# Patient Record
Sex: Female | Born: 1960 | Race: White | Hispanic: No | Smoking: Never smoker
Health system: Southern US, Community
[De-identification: ages and names within clinical notes are randomized; demographics above are authoritative.]

## PROBLEM LIST (undated history)

## (undated) DIAGNOSIS — S83511A Sprain of anterior cruciate ligament of right knee, initial encounter: Secondary | ICD-10-CM

## (undated) DIAGNOSIS — F50819 Binge eating disorder, unspecified: Secondary | ICD-10-CM

## (undated) DIAGNOSIS — R5383 Other fatigue: Secondary | ICD-10-CM

## (undated) DIAGNOSIS — S838X1A Sprain of other specified parts of right knee, initial encounter: Secondary | ICD-10-CM

## (undated) DIAGNOSIS — F5081 Binge eating disorder: Secondary | ICD-10-CM

## (undated) DIAGNOSIS — F319 Bipolar disorder, unspecified: Secondary | ICD-10-CM

## (undated) DIAGNOSIS — F329 Major depressive disorder, single episode, unspecified: Secondary | ICD-10-CM

## (undated) DIAGNOSIS — F32A Depression, unspecified: Secondary | ICD-10-CM

## (undated) DIAGNOSIS — G4730 Sleep apnea, unspecified: Secondary | ICD-10-CM

## (undated) HISTORY — DX: Other fatigue: R53.83

## (undated) HISTORY — PX: TUBAL LIGATION: SHX77

## (undated) HISTORY — DX: Binge eating disorder, unspecified: F50.819

## (undated) HISTORY — PX: ABDOMINAL HYSTERECTOMY: SHX81

## (undated) HISTORY — PX: DILATION AND CURETTAGE OF UTERUS: SHX78

## (undated) HISTORY — DX: Binge eating disorder: F50.81

## (undated) HISTORY — DX: Sleep apnea, unspecified: G47.30

---

## 1997-09-27 ENCOUNTER — Other Ambulatory Visit: Admission: RE | Admit: 1997-09-27 | Discharge: 1997-09-27 | Payer: Self-pay | Admitting: Gynecology

## 1999-02-01 ENCOUNTER — Other Ambulatory Visit: Admission: RE | Admit: 1999-02-01 | Discharge: 1999-02-01 | Payer: Self-pay | Admitting: Gynecology

## 1999-08-01 ENCOUNTER — Encounter: Payer: Self-pay | Admitting: Family Medicine

## 1999-08-01 ENCOUNTER — Encounter: Admission: RE | Admit: 1999-08-01 | Discharge: 1999-08-01 | Payer: Self-pay | Admitting: Family Medicine

## 1999-09-14 ENCOUNTER — Encounter (INDEPENDENT_AMBULATORY_CARE_PROVIDER_SITE_OTHER): Payer: Self-pay | Admitting: Specialist

## 1999-09-14 ENCOUNTER — Other Ambulatory Visit: Admission: RE | Admit: 1999-09-14 | Discharge: 1999-09-14 | Payer: Self-pay | Admitting: Gynecology

## 2000-02-04 ENCOUNTER — Other Ambulatory Visit: Admission: RE | Admit: 2000-02-04 | Discharge: 2000-02-04 | Payer: Self-pay | Admitting: Gynecology

## 2000-02-07 ENCOUNTER — Other Ambulatory Visit: Admission: RE | Admit: 2000-02-07 | Discharge: 2000-02-07 | Payer: Self-pay | Admitting: Gynecology

## 2000-02-07 ENCOUNTER — Encounter (INDEPENDENT_AMBULATORY_CARE_PROVIDER_SITE_OTHER): Payer: Self-pay | Admitting: Specialist

## 2001-05-13 ENCOUNTER — Other Ambulatory Visit: Admission: RE | Admit: 2001-05-13 | Discharge: 2001-05-13 | Payer: Self-pay | Admitting: Gynecology

## 2002-03-11 ENCOUNTER — Emergency Department (HOSPITAL_COMMUNITY): Admission: EM | Admit: 2002-03-11 | Discharge: 2002-03-11 | Payer: Self-pay | Admitting: Emergency Medicine

## 2002-05-06 HISTORY — PX: FRACTURE SURGERY: SHX138

## 2002-05-06 HISTORY — PX: WOUND DEBRIDEMENT: SHX247

## 2003-03-28 ENCOUNTER — Inpatient Hospital Stay (HOSPITAL_COMMUNITY): Admission: AC | Admit: 2003-03-28 | Discharge: 2003-04-04 | Payer: Self-pay | Admitting: Emergency Medicine

## 2003-04-19 ENCOUNTER — Observation Stay (HOSPITAL_COMMUNITY): Admission: EM | Admit: 2003-04-19 | Discharge: 2003-04-20 | Payer: Self-pay | Admitting: Emergency Medicine

## 2003-04-20 ENCOUNTER — Inpatient Hospital Stay (HOSPITAL_COMMUNITY): Admission: EM | Admit: 2003-04-20 | Discharge: 2003-04-30 | Payer: Self-pay | Admitting: Neurosurgery

## 2003-04-20 ENCOUNTER — Inpatient Hospital Stay (HOSPITAL_COMMUNITY): Admission: EM | Admit: 2003-04-20 | Discharge: 2003-04-20 | Payer: Self-pay | Admitting: Psychiatry

## 2003-05-07 HISTORY — PX: OVARIAN CYST SURGERY: SHX726

## 2003-05-30 ENCOUNTER — Other Ambulatory Visit: Admission: RE | Admit: 2003-05-30 | Discharge: 2003-05-30 | Payer: Self-pay | Admitting: Obstetrics and Gynecology

## 2003-12-29 ENCOUNTER — Other Ambulatory Visit: Admission: RE | Admit: 2003-12-29 | Discharge: 2003-12-29 | Payer: Self-pay | Admitting: Gynecology

## 2004-04-17 ENCOUNTER — Observation Stay (HOSPITAL_COMMUNITY): Admission: RE | Admit: 2004-04-17 | Discharge: 2004-04-18 | Payer: Self-pay | Admitting: Gynecology

## 2004-04-17 ENCOUNTER — Encounter (INDEPENDENT_AMBULATORY_CARE_PROVIDER_SITE_OTHER): Payer: Self-pay | Admitting: *Deleted
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
## 2005-12-30 ENCOUNTER — Other Ambulatory Visit: Admission: RE | Admit: 2005-12-30 | Discharge: 2005-12-30 | Payer: Self-pay | Admitting: Gynecology

## 2008-03-04 ENCOUNTER — Encounter: Payer: Self-pay | Admitting: Internal Medicine

## 2008-03-07 ENCOUNTER — Other Ambulatory Visit: Admission: RE | Admit: 2008-03-07 | Discharge: 2008-03-07 | Payer: Self-pay | Admitting: Gynecology

## 2008-03-10 ENCOUNTER — Ambulatory Visit: Payer: Self-pay | Admitting: Internal Medicine

## 2008-05-30 ENCOUNTER — Ambulatory Visit: Payer: Self-pay | Admitting: Internal Medicine

## 2008-06-07 ENCOUNTER — Encounter: Payer: Self-pay | Admitting: Internal Medicine

## 2008-11-30 ENCOUNTER — Ambulatory Visit: Payer: Self-pay | Admitting: Internal Medicine

## 2008-11-30 DIAGNOSIS — R635 Abnormal weight gain: Secondary | ICD-10-CM | POA: Insufficient documentation

## 2008-11-30 DIAGNOSIS — R5383 Other fatigue: Secondary | ICD-10-CM | POA: Insufficient documentation

## 2008-12-06 ENCOUNTER — Encounter (INDEPENDENT_AMBULATORY_CARE_PROVIDER_SITE_OTHER): Payer: Self-pay | Admitting: *Deleted

## 2008-12-06 ENCOUNTER — Telehealth (INDEPENDENT_AMBULATORY_CARE_PROVIDER_SITE_OTHER): Payer: Self-pay | Admitting: *Deleted

## 2008-12-06 LAB — CONVERTED CEMR LAB
ALT: 57 units/L — ABNORMAL HIGH (ref 0–35)
AST: 49 units/L — ABNORMAL HIGH (ref 0–37)
Albumin: 4 g/dL (ref 3.5–5.2)
Alkaline Phosphatase: 79 units/L (ref 39–117)
BUN: 10 mg/dL (ref 6–23)
Basophils Absolute: 0 10*3/uL (ref 0.0–0.1)
Basophils Relative: 0.6 % (ref 0.0–3.0)
Bilirubin, Direct: 0 mg/dL (ref 0.0–0.3)
CO2: 32 meq/L (ref 19–32)
Calcium: 8.9 mg/dL (ref 8.4–10.5)
Chloride: 107 meq/L (ref 96–112)
Creatinine, Ser: 0.8 mg/dL (ref 0.4–1.2)
Eosinophils Absolute: 0.1 10*3/uL (ref 0.0–0.7)
Eosinophils Relative: 3.1 % (ref 0.0–5.0)
Free T4: 0.8 ng/dL (ref 0.6–1.6)
GFR calc non Af Amer: 81.59 mL/min (ref >60)
Glucose, Bld: 91 mg/dL (ref 70–99)
HCT: 35.9 % — ABNORMAL LOW (ref 36.0–46.0)
Hemoglobin: 12.3 g/dL (ref 12.0–15.0)
Lymphocytes Relative: 31 % (ref 12.0–46.0)
Lymphs Abs: 1.5 10*3/uL (ref 0.7–4.0)
MCHC: 34.4 g/dL (ref 30.0–36.0)
MCV: 89.7 fL (ref 78.0–100.0)
Monocytes Absolute: 0.3 10*3/uL (ref 0.1–1.0)
Monocytes Relative: 7.2 % (ref 3.0–12.0)
Neutro Abs: 2.9 10*3/uL (ref 1.4–7.7)
Neutrophils Relative %: 58.1 % (ref 43.0–77.0)
Platelets: 205 10*3/uL (ref 150.0–400.0)
Potassium: 4.4 meq/L (ref 3.5–5.1)
RBC: 4 M/uL (ref 3.87–5.11)
RDW: 11.8 % (ref 11.5–14.6)
Sed Rate: 15 mm/hr (ref 0–22)
Sodium: 143 meq/L (ref 135–145)
TSH: 0.7 microintl units/mL (ref 0.35–5.50)
Total Bilirubin: 0.6 mg/dL (ref 0.3–1.2)
Total Protein: 6.7 g/dL (ref 6.0–8.3)
Vit D, 25-Hydroxy: 58 ng/mL (ref 30–89)
WBC: 4.8 10*3/uL (ref 4.5–10.5)

## 2009-03-13 ENCOUNTER — Telehealth: Payer: Self-pay | Admitting: Internal Medicine

## 2009-03-14 ENCOUNTER — Encounter (INDEPENDENT_AMBULATORY_CARE_PROVIDER_SITE_OTHER): Payer: Self-pay | Admitting: *Deleted

## 2009-04-26 ENCOUNTER — Ambulatory Visit: Payer: Self-pay | Admitting: Pulmonary Disease

## 2009-05-08 ENCOUNTER — Ambulatory Visit: Payer: Self-pay | Admitting: Pulmonary Disease

## 2009-05-08 ENCOUNTER — Ambulatory Visit (HOSPITAL_BASED_OUTPATIENT_CLINIC_OR_DEPARTMENT_OTHER): Admission: RE | Admit: 2009-05-08 | Discharge: 2009-05-08 | Payer: Self-pay | Admitting: Pulmonary Disease

## 2009-05-29 ENCOUNTER — Encounter: Payer: Self-pay | Admitting: Pulmonary Disease

## 2009-05-31 ENCOUNTER — Telehealth: Payer: Self-pay | Admitting: Pulmonary Disease

## 2009-11-15 ENCOUNTER — Telehealth: Payer: Self-pay | Admitting: Internal Medicine

## 2010-02-06 ENCOUNTER — Ambulatory Visit: Payer: Self-pay | Admitting: Internal Medicine

## 2010-02-06 ENCOUNTER — Encounter: Payer: Self-pay | Admitting: Internal Medicine

## 2010-02-06 DIAGNOSIS — F319 Bipolar disorder, unspecified: Secondary | ICD-10-CM | POA: Insufficient documentation

## 2010-02-07 LAB — CONVERTED CEMR LAB
ALT: 73 units/L — ABNORMAL HIGH (ref 0–35)
AST: 45 units/L — ABNORMAL HIGH (ref 0–37)
Albumin: 4.1 g/dL (ref 3.5–5.2)
Alkaline Phosphatase: 94 units/L (ref 39–117)
BUN: 11 mg/dL (ref 6–23)
Basophils Absolute: 0 10*3/uL (ref 0.0–0.1)
Basophils Relative: 1.1 % (ref 0.0–3.0)
Bilirubin, Direct: 0.1 mg/dL (ref 0.0–0.3)
CO2: 29 meq/L (ref 19–32)
Calcium: 9.6 mg/dL (ref 8.4–10.5)
Chloride: 105 meq/L (ref 96–112)
Cholesterol: 278 mg/dL — ABNORMAL HIGH (ref 0–200)
Creatinine, Ser: 0.9 mg/dL (ref 0.4–1.2)
Direct LDL: 194.7 mg/dL
Eosinophils Absolute: 0.1 10*3/uL (ref 0.0–0.7)
Eosinophils Relative: 1.9 % (ref 0.0–5.0)
GFR calc non Af Amer: 71.78 mL/min (ref >60)
Glucose, Bld: 94 mg/dL (ref 70–99)
HCT: 37.3 % (ref 36.0–46.0)
HDL: 73.1 mg/dL (ref >39.00)
Hemoglobin: 12.8 g/dL (ref 12.0–15.0)
Lymphocytes Relative: 34.9 % (ref 12.0–46.0)
Lymphs Abs: 1.4 10*3/uL (ref 0.7–4.0)
MCHC: 34.4 g/dL (ref 30.0–36.0)
MCV: 88.6 fL (ref 78.0–100.0)
Monocytes Absolute: 0.4 10*3/uL (ref 0.1–1.0)
Monocytes Relative: 9.4 % (ref 3.0–12.0)
Neutro Abs: 2.1 10*3/uL (ref 1.4–7.7)
Neutrophils Relative %: 52.7 % (ref 43.0–77.0)
Platelets: 239 10*3/uL (ref 150.0–400.0)
Potassium: 4.7 meq/L (ref 3.5–5.1)
RBC: 4.21 M/uL (ref 3.87–5.11)
RDW: 13.6 % (ref 11.5–14.6)
Sodium: 142 meq/L (ref 135–145)
TSH: 0.89 microintl units/mL (ref 0.35–5.50)
Total Bilirubin: 0.4 mg/dL (ref 0.3–1.2)
Total CHOL/HDL Ratio: 4
Total Protein: 6.9 g/dL (ref 6.0–8.3)
Triglycerides: 102 mg/dL (ref 0.0–149.0)
VLDL: 20.4 mg/dL (ref 0.0–40.0)
WBC: 4 10*3/uL — ABNORMAL LOW (ref 4.5–10.5)

## 2010-05-06 HISTORY — PX: OTHER SURGICAL HISTORY: SHX169

## 2010-06-03 LAB — CONVERTED CEMR LAB
ALT: 29 units/L (ref 0–35)
AST: 24 units/L (ref 0–37)
Albumin: 4 g/dL (ref 3.5–5.2)
Alkaline Phosphatase: 49 units/L (ref 39–117)
BUN: 12 mg/dL (ref 6–23)
Basophils Absolute: 0 10*3/uL (ref 0.0–0.1)
Basophils Relative: 0.6 % (ref 0.0–3.0)
Bilirubin, Direct: 0.1 mg/dL (ref 0.0–0.3)
CO2: 30 meq/L (ref 19–32)
Calcium: 9.2 mg/dL (ref 8.4–10.5)
Chloride: 106 meq/L (ref 96–112)
Cholesterol: 184 mg/dL (ref 0–200)
Creatinine, Ser: 0.9 mg/dL (ref 0.4–1.2)
Eosinophils Absolute: 0.1 10*3/uL (ref 0.0–0.7)
Eosinophils Relative: 1.9 % (ref 0.0–5.0)
GFR calc Af Amer: 87 mL/min
GFR calc non Af Amer: 72 mL/min
Glucose, Bld: 77 mg/dL (ref 70–99)
HCT: 38.1 % (ref 36.0–46.0)
HDL: 51 mg/dL (ref >39.0)
Hemoglobin: 13.1 g/dL (ref 12.0–15.0)
LDL Cholesterol: 121 mg/dL — ABNORMAL HIGH (ref 0–99)
Lymphocytes Relative: 23.7 % (ref 12.0–46.0)
MCHC: 34.3 g/dL (ref 30.0–36.0)
MCV: 87.8 fL (ref 78.0–100.0)
Monocytes Absolute: 0.4 10*3/uL (ref 0.1–1.0)
Monocytes Relative: 7.6 % (ref 3.0–12.0)
Neutro Abs: 3.4 10*3/uL (ref 1.4–7.7)
Neutrophils Relative %: 66.2 % (ref 43.0–77.0)
Platelets: 217 10*3/uL (ref 150–400)
Potassium: 4.3 meq/L (ref 3.5–5.1)
RBC: 4.34 M/uL (ref 3.87–5.11)
RDW: 12.3 % (ref 11.5–14.6)
Sodium: 141 meq/L (ref 135–145)
TSH: 0.65 microintl units/mL (ref 0.35–5.50)
Total Bilirubin: 0.6 mg/dL (ref 0.3–1.2)
Total CHOL/HDL Ratio: 3.6
Total Protein: 6.6 g/dL (ref 6.0–8.3)
Triglycerides: 59 mg/dL (ref 0–149)
VLDL: 12 mg/dL (ref 0–40)
WBC: 5.1 10*3/uL (ref 4.5–10.5)

## 2010-06-05 NOTE — Progress Notes (Signed)
Summary: appt.  Phone Note Call from Patient Call back at [facility] Phone [phone]   Caller: Patient Call For: Anita Laguna Summary of Call: FYI:  Pt says she isn't interested in a follow-up appt. Initial call taken by: Darletta Moll,  May 31, 2009 9:21 AM  Follow-up for Phone Call        Dr. Armandina Gemma. Zackery Barefoot CMA  May 31, 2009 9:24 AM   Additional Follow-up for Phone Call Additional follow up Details #1::       Additional Follow-up by: Coralyn Helling MD,  May 31, 2009 9:45 AM
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
## 2010-06-05 NOTE — Miscellaneous (Signed)
Summary: Polysomnogram   Clinical Lists Changes From 05/08/09.  RDI 15.  Had sinus tachycardia.  Will have my nurse call to schedule ROV to review.  Appended Document: Polysomnogram  LMTCB.  Appended Document: Polysomnogram  LMTCB.  Appended Document: Polysomnogram  Patient called on 05/31/09 and said she is not interested in a follow-up appt and Dr. Craige Cotta is aware and signed the phone note.

## 2010-06-05 NOTE — Progress Notes (Signed)
Summary: Blood Type  Phone Note Call from Patient Call back at [phone]   Summary of Call: Patient left message on triage that [place] and needs to know her blood type. Please advise how you recommend patient acquire this info. Thanks. Initial call taken by: Lucious Groves,  November 15, 2009 11:05 AM  Follow-up for Phone Call        Because blood typing can be done very quickly now , routine typing is no longer done except when blood is donated.  Follow-up by: Marga Melnick MD,  November 15, 2009 1:08 PM  Additional Follow-up for Phone Call Additional follow up Details #1::        left message on voicemail to call back to office. Lucious Groves  November 15, 2009 2:06 PM     Additional Follow-up for Phone Call Additional follow up Details #2::    Patient notified, and she will contact the [facility] cross to get it. Per patient she donated blood as recently as last year. Follow-up by: Lucious Groves CMA,  November 15, 2009 3:24 PM

## 2010-09-21 NOTE — Discharge Summary (Signed)
NAMEJAMILETTE, Jessica Fitzgerald              ACCOUNT NO.:  [id number]   MEDICAL RECORD NO.:  [id number]          PATIENT TYPE:  OBV   LOCATION:  0447                         FACILITY:  [facility]   PHYSICIAN:  Almedia Balls. Fore, M.D.   DATE OF BIRTH:  1960/08/06   DATE OF ADMISSION:  04/17/2004  DATE OF DISCHARGE:  04/18/2004                                 DISCHARGE SUMMARY   HISTORY:  Patient is a 50 year old with abnormal uterine bleeding, pelvic  pain for hysterectomy, probable bilateral salpingo-oophorectomy on April 17, 2004.  The remainder of her history and physical are as previously  dictated.   LABORATORY DATA:  Preoperative hemoglobin 11.8, 5000 white blood cells.   HOSPITAL COURSE:  The patient was taken to the operating room on April 17, 2004 at which time TAH/BSO was performed without difficulty.  The  patient did well postoperatively.  Diet and ambulation were progressed over  the evening of December 13 and early morning of December 14.  The patient  had had her Foley catheter removed on the evening of December 13 and was  voiding without difficulty.  On the morning of December 14 she was afebrile  and experiencing no problems except for pain which was controlled with oral  analgesics.  It was felt that she could be discharged at this time.   FINAL DIAGNOSES:  1.  Pelvic pain.  2.  Endometriosis.  3.  Abnormal uterine bleeding.   OPERATION:  Total abdominal hysterectomy/bilateral salpingo-oophorectomy.  Pathology report unavailable at the time of dictation.   DISPOSITION:  Discharged home to return to the office in approximately three  days for removal of staples and for follow-ups to be arranged following  this.  She was fully ambulatory, on a regular diet, and in good condition at  the time of discharge.  She is instructed to gradually progress her  activities at home over the next several weeks and to limit lifting and  driving for approximately six weeks.  She was  given a prescription for  Percocet generic #30 to be taken one-half to one q.4-6h. p.r.n. pain and  Climara Pro #4 to be changed once weekly for hormonal support.      SRF/MEDQ  D:  04/18/2004  T:  04/18/2004  Job:  [id number]
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
## 2010-09-21 NOTE — Op Note (Signed)
NAMEKYRA, LAFFEY              ACCOUNT NO.:  [id number]   MEDICAL RECORD NO.:  [id number]          PATIENT TYPE:  AMB   LOCATION:  DAY                          FACILITY:  [facility]   PHYSICIAN:  Howard C. Mezer, M.D.  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   OPERATION PERFORMED:  Total abdominal hysterectomy and bilateral salpingo-  oophorectomy.   SURGEON:  Dr. Teodora Medici   ASSISTANT:  Dr. Almedia Balls. Fore   ANESTHESIA:  General endotracheal.   PREPARATION:  Betadine.   DESCRIPTION OF PROCEDURE:  With the patient in the supine position, prepped  and draped in routine fashion.  A Pfannenstiel incision was made through the  skin and subcutaneous tissue.  The fascia and peritoneum were opened without  difficulty.  Throughout the case, from the subcutaneous tissue to the pelvic  sidewall and bladder area, there was significant oozing and vascular  fragility.  Meticulous attention to hemostasis was paid at each level of the  procedure.  After the peritoneum was opened, pelvic washings were obtained  but were later discarded.  A brief exploration of her upper abdomen was  benign.  Exploration of the pelvis revealed the uterus to be top-normal in  size.  Left tube and ovary were normal.  There were stained areas of  endometriosis in the peritoneum which were cauterized, and the right ovary  contained a chocolate cyst, and the right ovary was in the pelvic sidewall.  The round ligaments were suture ligated with #1 chromic and divided by  cautery.  The anterior leaf of the broad ligament was opened, and the  bladder was easily taken down.  The right ovary being in the pelvic sidewall  was left until after the hysterectomy when the ureter could be  better  identified.  The right uteroovarian ligament was clamped, cut, and free tied  with #1 chromic.  The left infundibulopelvic ligament was isolated, the  ureter identified, the infundibulopelvic ligament clamped, cut, and free  tied with #1 chromic and suture ligated with #1 chromic.  The uterine  vessels were clamped, cut, and suture ligated bilaterally.  The cardinal  ligaments taken in several bites, clamped, cut, and suture ligated  bilaterally.  The uterosacral ligaments taken separately clamped, cut, and  suture ligated.  The vagina was entered on the right side and the specimen  excised with circumferential dissection.  There were deep fornices, and a  great effort was made to preserve as much vaginal length as possible.  The  cervix appeared to be intact.  The vaginal angles were then sutured with  TeLinde sutures of #1 chromic.  The cuff was then whipped anteriorly and  posteriorly with running lock #1 chromic suture.  Meticulous attention was  paid to hemostasis at the base of the bladder, taking care not to harm the  bladder.  The fascial cuff was then approximated with two interrupted #1  chromic sutures.  The knots were placed at the posterior side.  Attention  was then paid to the right ovary.  The ureter was reidentified.  The  infundibulopelvic ligament isolated, clamped, cut, and free tied with #1  chromic and then suture ligated with #1 chromic.  The ovary was then  dissected free of the sidewall, taking care not to harm the ureter.  Several  bleeding spots were arrested with cautery.  The appendix was inspected and  found to be normal.  The pelvis was irrigated with copious amounts of warm  lactated Ringer's solution and, once again, meticulous attention was paid to  hemostasis, and the bladder was placed over the vaginal cuff with a running  3-0 Vicryl suture.  At the end of the procedure, both ureters were  reinspected, found to be out of harm's way, saw dilated  and peristalsing.  An effort was made to place the large bowel in the cul-de-sac.  The omentum  was brought down.  The abdomen was closed in layers using a running 2-0  Vicryl on the peritoneum, running 0 Vicryl to the midline bilaterally on the  fascia.  Hemostasis was assured in the subcutaneous tissue, and the skin was  closed with staples.  It took a significant amount of time to assure  hemostasis in the subfascial area, and a pressure dressing was applied.  The  estimated blood loss was 300 mL.  The sponge, instrument, and needle counts  were correct x 2.  The patient tolerated the procedure well and was taken to  recovery room in satisfactory condition.      HCM/MEDQ  D:  04/17/2004  T:  04/17/2004  Job:  [id number]   cc:   Teena Irani. Arlyce Dice, M.D.  [address]  [place]  [place] [address]  Fax: [phone]   Titus Dubin. Alwyn Ren, M.D. [facility]

## 2010-09-21 NOTE — H&P (Addendum)
NAMEADAMARIZ, GILLOTT              ACCOUNT NO.:  [id number]   MEDICAL RECORD NO.:  [id number]          PATIENT TYPE:  AMB   LOCATION:  DAY                          FACILITY:  [facility]   PHYSICIAN:  Howard C. Mezer, M.D.  DATE OF BIRTH:  December 09, 1960   DATE OF ADMISSION:  04/17/2004  DATE OF DISCHARGE:                                HISTORY & PHYSICAL   ADMISSION DIAGNOSIS:  Ovarian cyst.   Patient is a 50 year old admitted with last menstrual period of April 07, 2004 with a longstanding ovarian cyst for total abdominal hysterectomy and  bilateral salpingo-oophorectomy.  The patient had been a longstanding  patient in our practice until 2002 and had been under the care of another  physician until August, 2005.  She has had somewhat irregular cycles and was  considering a uterine ablation with the other physician.  The Pap smear from  August, 2005 revealed CIN I, and colposcopic-directed biopsies revealed CIN  I with cervicitis.  Ultrasound examination in the other physician's office  in February, 2005 revealed a complex cyst, and the patient was scheduled for  a follow-up ultrasound.  In September, 2005, the ultrasound examination  revealed a right ovarian cyst measuring 23.1 x 20.2 mm and was solid, and a  17.3 x 8 cyst with irregular borders was also noted at that time and  revealed an anterior submucous fibroid that was 80% in the pelvic side wall.  Follow-up ultrasound was performed on March 19, 2004, at which time the  right ovarian cyst measured 22.5 x 19.0 mm with internal echoes.  The  probability of endometriosis was raised, and ovarian cancer needed to be  ruled out.  A CA-125 was obtained, which was returned at 47.0.  After  consultation on March 27, 2004, the patient decided with a history of  pelvic adhesions, the cyst on the ultrasound, elevated CA-125, and CIN I on  the Pap smear as well as the previously irregular bleeding, that she wished  to proceed with a total  abdominal hysterectomy and bilateral salpingo-  oophorectomy.  The patient was offered a consultation with Dr. Rande Brunt.  Clarke[place]Pearson to discuss alternatives.  With the history of endometriosis  and this appearing to be endometriosis, the CA-125 could represent  endometriosis or possibly ovarian cancer.  The patient has decided that  because of the above reasons, she declines the consultation with Dr. Stanford Breed and wishes to proceed with hysterectomy.  She understands that this  will result in permanent sterilization and that she will not be able to  become pregnant.  A total abdominal hysterectomy, bilateral salpingo-  oophorectomy, lysis of adhesions have been discussed with the patient in  detail.  Potential complications, including but not limited to, anesthesia,  injury to the bowel, bladder, ureters, possible fistula formation, have been  discussed with the patient in detail.  Possible blood loss with transfusion  and sequelae, possible infection in the wound in the pelvis have also been  discussed.  The patient has reviewed the ACOG booklet on hysterectomy.  Postoperative expectations and restrictions have been reviewed in detail.  Pain control has been discussed.  A bowel prep and possible ovarian cancer  have been discussed.  If possible, postoperative hormone replacement  therapy, +/- progestin have also been discussed.  The patient wishes to  schedule the surgery at this time, as she is eager to proceed, despite my  personal schedule, which would involve my leaving town the evening of  surgery.  Patient understands that Dr. Jeanine Luz will provide care  postoperatively.  All of the patient's questions have been answered.  She  appears to understand the alternatives to the proposed procedure and appears  to have realistic expectations regarding the procedure.   PAST SURGICAL HISTORY:  Diagnostic laparoscopy, QA MARKER: 488____ cyst,                                                     leg and arm.   PAST MEDICAL HISTORY:  Noncontributory.   MEDICATIONS:  Effexor.  No herbs or supplements.   ALLERGIES:  SULFA.   SMOKES:  None.   ETOH:  None.   SOCIAL HISTORY:  The patient is married and is employed as an [employer].   FAMILY HISTORY:  Negative for carcinoma.   PHYSICAL EXAMINATION:  HEENT:  Negative.  LUNGS:  Clear.  HEART:  Without murmurs.  BREASTS:  Without mass or discharge.  ABDOMEN:  Soft and nontender.  PELVIC:  BUS, vagina, and cervix to be normal.  The uterus is anterior and  normal in size.  The adnexa are without palpable masses.  RECTAL:  Negative.  EXTREMITIES:  Negative.   IMPRESSION:  1.  Complex ovarian cyst, cervical intraepithelial neoplasia I.  2.  History of endometriosis, fibroid.   PLAN:  Total abdominal hysterectomy and bilateral salpingo-oophorectomy.   ADDENDUM:  A repeat CA-125 was obtained that returned at 34.7.  The patient  was informed of the decrease in the CA-125 and still adamantly wishes to  proceed with hysterectomy.      HCM/MEDQ  D:  04/16/2004  T:  04/16/2004  Job:  [id number]   cc:   Teena Irani. Arlyce Dice, M.D.  [address]  [place]  [place] [address]  Fax: [phone]   Titus Dubin. Alwyn Ren, M.D. [facility]
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
## 2010-09-21 NOTE — Op Note (Signed)
NAME:  Jessica Fitzgerald, Jessica Fitzgerald                        ACCOUNT NO.:  [id number]   MEDICAL RECORD NO.:  [id number]                   PATIENT TYPE:  INP   LOCATION:  2912                                 FACILITY:  [facility]   PHYSICIAN:  Elana Alm. Thurston Hole, M.D.              DATE OF BIRTH:  March 14, 1961   DATE OF PROCEDURE:  04/21/2003  DATE OF DISCHARGE:                                 OPERATIVE REPORT   PREOPERATIVE DIAGNOSIS:  Left forearm compartment syndrome.   POSTOPERATIVE DIAGNOSIS:  Left forearm compartment syndrome.   OPERATION PERFORMED:  Left forearm fasciotomy.   SURGEON:  Elana Alm. Thurston Hole, M.D.   ASSISTANT:  1. Cindee Salt, M.D.  2. Arlys John D. Petrarca, P.A.-C.   ANESTHESIA:  General.   OPERATIVE TIME:  30 minutes.   COMPLICATIONS:  None.   DESCRIPTION OF PROCEDURE:  Ms. Mcguffin was brought to the operating room on  April 21, 2003, placed on the operating table in supine position.  After  an adequate level of general anesthesia was obtained, her left arm was  prepped using sterile DuraPrep and draped using sterile technique.  She had  erythema in her olecranon bursa which was aspirated but only interstitial  fluid was obtained and this was sent for Gram stain and culture.  Her left  knee was examined from previous tibial plateau fracture.  Range of motion  from 0 to 100 degrees.  Knee was stable.  Intraoperative x-rays were  obtained which showed satisfactory maintainment of her hardware and position  of the fracture fragments.  After the left arm was prepped and draped with  DuraPrep, then the arm was elevated and a tourniquet was briefly placed at  250 mmHg.  At this point then a 20 cm volar longitudinal curvilinear  incision was made along the entire volar length of the forearm.  The  underling subcutaneous tissues were incised in line with the skin incision.  The soft tissues were significantly infiltrated with fluid.  The fascia over  the volar forearm muscles was incised  longitudinally as well.  The muscles  were pink and viable as was the median nerve.  The fasciotomy was completed  from the level of the wrist to the level of the anterior antecubital space.  I did not feel that a carpal tunnel release was indicated because the fluid  extravasation was proximal to this.  After this was done, the upper arm was  palpated but this was soft and did not feel that a fasciotomy needed to be  extended.  At this point then the tourniquet was released.  Again, the  forearm musculature was very soft after this fasciotomy had been completed  and viable and pink.  Pulses remained intact.  Venous bleeders were  cauterized.  The wound was irrigated and then approximated loosely with 2-0  nylon suture.  Sterile dressings were applied and then the patient awakened  and taken to the  recovery room in stable condition.  Sponge and needle  counts were correct times two at the end of this case.                                              Robert A. Thurston Hole, M.D.   RAW/MEDQ  D:  04/21/2003  T:  04/21/2003  Job:  [id number]
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
## 2010-09-21 NOTE — Discharge Summary (Signed)
NAME:  Jessica Fitzgerald, Jessica Fitzgerald                        ACCOUNT NO.:  [id number]   MEDICAL RECORD NO.:  [id number]                   PATIENT TYPE:  IPS   LOCATION:  0404                                 FACILITY:  [facility]   PHYSICIAN:  Carolanne Grumbling, M.D.                 DATE OF BIRTH:  25-Dec-1960   DATE OF ADMISSION:  04/20/2003  DATE OF DISCHARGE:  04/20/2003                                 DISCHARGE SUMMARY   COMBINED ADMISSION AND DISCHARGE SUMMARY   INITIAL ASSESSMENT AND DIAGNOSIS:  Jessica Fitzgerald was a 50 year old female.  Ms.  Fitzgerald was admitted for a period of hours.  Her admission seemed to be an  inappropriate one to this facility.  She had been treated at the [facility] and transferred to this facility.  She was presumably  medically cleared at the time of the transfer, however when she arrived on  this unit she appeared to be delirious.  She was not cognizant of time,  person or place.  She came over in 4-point restraints via  care-link.  She  did not respond to verbal stimuli, however she would thrash about and was  basically talking out of her head, appearing to be talking to herself or  hearing voices, but again because she would not respond to verbal  interventions there was no real way of knowing.  At times, she would yell  and scream, at other times she would be quiet.  She at times would be  thrashing around and could even get out of her restraints.  She would repeat  things in a repetitive fashion.  Other times she would be crying and other  times quiet.  Consequently, she was discharged from this unit back to the  general hospital on the neurosurgical unit.   INITIAL AND FINAL DIAGNOSES:   AXIS I:  1. 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  2. Rule out bipolar disorder versus other psychosis.   AXIS II:  Deferred.   AXIS III:  Recent history of skull fracture.   AXIS IV:  Deferred.   AXIS V:  20/75.   POST HOSPITAL CARE PLAN:  She was  transferred back to the general hospital  for further evaluation and treatment.  No medications were prescribed at the  time of discharge.  She was given Geodon while she was in this hospital.                                               Carolanne Grumbling, M.D.    GT/MEDQ  D:  05/12/2003  T:  05/12/2003  Job:  [id number]

## 2010-09-21 NOTE — Op Note (Signed)
Jessica Fitzgerald, Jessica Fitzgerald                        ACCOUNT NO.:  [id number]   MEDICAL RECORD NO.:  [id number]                   PATIENT TYPE:  INP   LOCATION:  3313                                 FACILITY:  [facility]   PHYSICIAN:  Elana Alm. Thurston Hole, M.D.              DATE OF BIRTH:  July 05, 1960   DATE OF PROCEDURE:  03/29/2003  DATE OF DISCHARGE:                                 OPERATIVE REPORT   PREOPERATIVE DIAGNOSES:  1. Left tibial plateau fracture.  2. Left anterior cruciate ligament tear.   POSTOPERATIVE DIAGNOSES:  1. Left tibial plateau fracture.  2. Left anterior cruciate ligament tear.   PROCEDURE:  1. Open reduction internal fixation of left tibial plateau fracture.  2. Left anterior cruciate ligament repair.   SURGEON:  Elana Alm. Thurston Hole, M.D.   ASSISTANT:  Gifford Shave, P.A.   ANESTHESIA:  General.   OPERATIVE TIME:  One hour and 45 minutes.   COMPLICATIONS:  None.   DESCRIPTION OF PROCEDURE:  Ms. Dilling was brought to the operating room on  03/29/03.  She was placed under general anesthesia on her own bed and then  carefully transferred to the operative table.  She received Ancef 1 g IV  preoperatively for prophylaxis.  Her left leg was prepped using sterile  Duraprep and draped using sterile technique.  The leg was exsanguinated and  a tourniquet elevated at 350 mm.  Initially, through a 15 cm longitudinal  incision based over the anterior proximal tibia up to the patella, initial  exposure was made.  The underlying subcutaneous tissues were incised in line  with the skin incision.  The subcutaneous tissues were incised  longitudinally revealing the underlying fracture.  Subperiosteally, the  fracture was exposed.  She was found to have a large fragment in the lateral  tibial plateau.  The tibial tubercle and medial condyle were still intact.  There was a tibial eminence fracture with the anterior cruciate ligament  attached to the tibial eminence and the tibial  eminence was a separate loose  piece.  Hematoma was removed from around the fracture site.  The medial and  lateral menisci were found to be intact.  The posterior cruciate was intact.  The articular surfaces on the femoral condyles were intact and the medial  tibial plateau was intact as well.  Careful dissection was then carried out  around laterally to the level of the fibular head on the lateral condyle  fracture so that the fracture could then be reduced.  Prior to doing this,  then using #2 Fibrewire in a mattress suture technique, multiple stitches  were placed in the anterior cruciate ligament and then placed through the  tibial spine eminence piece and then two drill holes placed in the anterior  and medial tibia and each of these used to pass the Fibrewire out over the  anteromedial tibia to be tied later, thus securely refixing the anterior  cruciate ligament.  After this was done, then the lateral tibial plateau  fracture was held in a reduced near anatomic position and held there with a  clamp while an eight hole Dell locking Synthes plate was placed on the  proximal lateral tibial plateau surface, going down the tibial shaft.  The  three most proximal screw holes were drilled under fluoroscopy and then  measured and then the appropriate length cannulated 5.0 mm screws were  placed, thus holding the fracture in a reduced and anatomic position.  The  three most distal screw holes in the plate were drilled, measured, tapped  and the appropriate length 4.5 mm cortical screws were placed.  After this  was done, AP and lateral fluoroscopic x-rays confirmed anatomic reduction of  the fracture and satisfactory position of the hardware.  One further  interfragmentary screw was placed in the mid portion of the plate, further  securing the lateral condyle of the tibial plateau back to the medial side.  After this was done and the anterior cruciate ligament sutures were tied  tightly over  the medial bone bridge.  This helped secure the anterior  cruciate ligament in place.  At this point then, the wound was copiously  irrigated.  The arthrotomy was closed with 2-0 Vicryl suture.  The fascia  over the plate was closed loosely with 0 Vicryl.  The tourniquet had been  released.  There was diffuse venous oozing but not excessive bleeding was  noted.  The wound was copiously irrigated.  The subcutaneous tissues were  closed with 0 and 2-0 Vicryl, skin closed with skin staples.  Sterile  dressings were applied and a long leg splint.  The patient then checked and  pulses were found to be 2+ and equal.  The patient then had a femoral nerve  block placed by anesthesia for postoperative pain control.  She was then  awakened, extubated and taken to the recovery room in a stable condition.  Needle and sponge counts were correct x2 at the end of the case.                                               Robert A. Thurston Hole, M.D.    RAW/MEDQ  D:  03/29/2003  T:  03/30/2003  Job:  [id number]
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
## 2010-09-21 NOTE — Op Note (Signed)
NAME:  Jessica Fitzgerald, Jessica Fitzgerald                        ACCOUNT NO.:  [id number]   MEDICAL RECORD NO.:  [id number]                   PATIENT TYPE:  INP   LOCATION:  2912                                 FACILITY:  [facility]   PHYSICIAN:  Tia Alert, MD                  DATE OF BIRTH:  [birth year]   DATE OF PROCEDURE:  04/21/2003  DATE OF DISCHARGE:                                 OPERATIVE REPORT   PROCEDURE PERFORMED:  Lumbar puncture.   SURGEON:  Tia Alert, MD   ANESTHESIA:  Local.   DESCRIPTION OF PROCEDURE:  The patient was placed  in the lateral decubitus  position.  Her lumbar region was prepped with Betadine and then draped in  the usual sterile fashion.  2 mL of local anesthesia was injected and then I  used the lumbar puncture needle to perform an LP at the L3-4 interspace with  good flow of clear CSF.  The opening pressure was 7 cmH2O.  I removed about  10 mL of CSF and sent it for studies. The patient tolerated the procedure  well.                                               Tia Alert, MD    DSJ/MEDQ  D:  04/21/2003  T:  04/21/2003  Job:  [id number]

## 2011-08-14 ENCOUNTER — Other Ambulatory Visit: Payer: Self-pay | Admitting: Orthopedic Surgery

## 2011-08-14 ENCOUNTER — Encounter (HOSPITAL_BASED_OUTPATIENT_CLINIC_OR_DEPARTMENT_OTHER): Payer: Self-pay | Admitting: *Deleted

## 2011-08-14 NOTE — Progress Notes (Signed)
No labs needed

## 2011-08-15 ENCOUNTER — Encounter (HOSPITAL_BASED_OUTPATIENT_CLINIC_OR_DEPARTMENT_OTHER): Payer: Self-pay | Admitting: Anesthesiology

## 2011-08-15 ENCOUNTER — Ambulatory Visit (HOSPITAL_BASED_OUTPATIENT_CLINIC_OR_DEPARTMENT_OTHER): Payer: PRIVATE HEALTH INSURANCE | Admitting: Anesthesiology

## 2011-08-15 ENCOUNTER — Encounter (HOSPITAL_BASED_OUTPATIENT_CLINIC_OR_DEPARTMENT_OTHER)
Admission: RE | Disposition: A | Discharge status: Home / Self Care | Payer: Self-pay | Source: Ambulatory Visit | Attending: Orthopedic Surgery

## 2011-08-15 ENCOUNTER — Encounter (HOSPITAL_BASED_OUTPATIENT_CLINIC_OR_DEPARTMENT_OTHER): Payer: Self-pay | Admitting: *Deleted

## 2011-08-15 ENCOUNTER — Ambulatory Visit (HOSPITAL_BASED_OUTPATIENT_CLINIC_OR_DEPARTMENT_OTHER)
Admission: RE | Admit: 2011-08-15 | Discharge: 2011-08-15 | Disposition: A | Discharge status: Home / Self Care | Payer: PRIVATE HEALTH INSURANCE | Source: Ambulatory Visit | Attending: Orthopedic Surgery | Admitting: Orthopedic Surgery

## 2011-08-15 DIAGNOSIS — F3130 Bipolar disorder, current episode depressed, mild or moderate severity, unspecified: Secondary | ICD-10-CM | POA: Insufficient documentation

## 2011-08-15 DIAGNOSIS — S52599A Other fractures of lower end of unspecified radius, initial encounter for closed fracture: Secondary | ICD-10-CM | POA: Insufficient documentation

## 2011-08-15 DIAGNOSIS — W19XXXA Unspecified fall, initial encounter: Secondary | ICD-10-CM | POA: Insufficient documentation

## 2011-08-15 HISTORY — DX: Depression, unspecified: F32.A

## 2011-08-15 HISTORY — DX: Major depressive disorder, single episode, unspecified: F32.9

## 2011-08-15 HISTORY — DX: Bipolar disorder, unspecified: F31.9

## 2011-08-15 SURGERY — OPEN REDUCTION INTERNAL FIXATION (ORIF) DISTAL RADIUS FRACTURE
Anesthesia: General | Site: Wrist | Laterality: Right | Wound class: Clean

## 2011-08-15 MED ORDER — CHLORHEXIDINE GLUCONATE 4 % EX LIQD: 60.0000 mL | Freq: Once | CUTANEOUS | Status: DC

## 2011-08-15 MED ORDER — METOCLOPRAMIDE HCL 5 MG/ML IJ SOLN: 10.0000 mg | Freq: Once | INTRAMUSCULAR | Status: DC | PRN

## 2011-08-15 MED ORDER — DEXAMETHASONE SODIUM PHOSPHATE 4 MG/ML IJ SOLN
INTRAMUSCULAR | Status: DC | PRN
  Administered 2011-08-15: 4 mg via INTRAVENOUS

## 2011-08-15 MED ORDER — FENTANYL CITRATE 0.05 MG/ML IJ SOLN
50.0000 ug | INTRAMUSCULAR | Status: DC | PRN
  Administered 2011-08-15: 100 ug via INTRAVENOUS

## 2011-08-15 MED ORDER — LIDOCAINE HCL (CARDIAC) 20 MG/ML IV SOLN
INTRAVENOUS | Status: DC | PRN
  Administered 2011-08-15: 100 mg via INTRAVENOUS

## 2011-08-15 MED ORDER — LIDOCAINE-EPINEPHRINE 1.5-1:200000 % IJ SOLN
INTRAMUSCULAR | Status: DC | PRN
  Administered 2011-08-15: 15 mL via INTRADERMAL

## 2011-08-15 MED ORDER — PROPOFOL 10 MG/ML IV EMUL
INTRAVENOUS | Status: DC | PRN
  Administered 2011-08-15: 200 mg via INTRAVENOUS

## 2011-08-15 MED ORDER — MIDAZOLAM HCL 2 MG/2ML IJ SOLN
0.5000 mg | INTRAMUSCULAR | Status: DC | PRN
  Administered 2011-08-15: 2 mg via INTRAVENOUS

## 2011-08-15 MED ORDER — CEFAZOLIN SODIUM 1-5 GM-% IV SOLN: 1.0000 g | INTRAVENOUS | Status: DC

## 2011-08-15 MED ORDER — ONDANSETRON HCL 4 MG/2ML IJ SOLN
INTRAMUSCULAR | Status: DC | PRN
  Administered 2011-08-15: 4 mg via INTRAVENOUS

## 2011-08-15 MED ORDER — HYDROCODONE-ACETAMINOPHEN 5-325 MG PO TABS: ORAL_TABLET | ORAL | Status: DC

## 2011-08-15 MED ORDER — MORPHINE SULFATE 10 MG/ML IJ SOLN: 0.0500 mg/kg | INTRAMUSCULAR | Status: DC | PRN

## 2011-08-15 MED ORDER — LACTATED RINGERS IV SOLN
INTRAVENOUS | Status: DC
  Administered 2011-08-15: 08:00:00 via INTRAVENOUS

## 2011-08-15 MED ORDER — CEFAZOLIN SODIUM 1-5 GM-% IV SOLN
INTRAVENOUS | Status: DC | PRN
  Administered 2011-08-15: 1 g via INTRAVENOUS

## 2011-08-15 MED ORDER — FENTANYL CITRATE 0.05 MG/ML IJ SOLN: 25.0000 ug | INTRAMUSCULAR | Status: DC | PRN

## 2011-08-15 MED ORDER — BUPIVACAINE HCL (PF) 0.5 % IJ SOLN
INTRAMUSCULAR | Status: DC | PRN
  Administered 2011-08-15: 15 mL

## 2011-08-15 SURGICAL SUPPLY — 76 items
BANDAGE CONFORM 3  STR LF (GAUZE/BANDAGES/DRESSINGS) IMPLANT
BANDAGE ELASTIC 3 VELCRO ST LF (GAUZE/BANDAGES/DRESSINGS) ×2 IMPLANT
BANDAGE GAUZE ELAST BULKY 4 IN (GAUZE/BANDAGES/DRESSINGS) ×2 IMPLANT
BIT DRILL 2.0 LNG QUCK RELEASE (BIT) ×1 IMPLANT
BIT DRILL 2.8X5 QR DISP (BIT) ×1 IMPLANT
BLADE MINI RND TIP GREEN BEAV (BLADE) IMPLANT
BLADE SURG 15 STRL LF DISP TIS (BLADE) ×2 IMPLANT
BLADE SURG 15 STRL SS (BLADE) ×4
BNDG CMPR 9X4 STRL LF SNTH (GAUZE/BANDAGES/DRESSINGS) ×1
BNDG CMPR MD 5X2 ELC HKLP STRL (GAUZE/BANDAGES/DRESSINGS) ×1
BNDG ELASTIC 2 VLCR STRL LF (GAUZE/BANDAGES/DRESSINGS) ×2 IMPLANT
BNDG ESMARK 4X9 LF (GAUZE/BANDAGES/DRESSINGS) ×2 IMPLANT
BONE CHIP PRESERV 5CC PCAN5 (Bone Implant) ×2 IMPLANT
CHLORAPREP W/TINT 26ML (MISCELLANEOUS) ×2 IMPLANT
CLOTH BEACON ORANGE TIMEOUT ST (SAFETY) ×2 IMPLANT
CORDS BIPOLAR (ELECTRODE) ×2 IMPLANT
COVER MAYO STAND STRL (DRAPES) ×2 IMPLANT
COVER TABLE BACK 60X90 (DRAPES) ×2 IMPLANT
DRAPE EXTREMITY T 121X128X90 (DRAPE) ×2 IMPLANT
DRAPE OEC MINIVIEW 54X84 (DRAPES) ×2 IMPLANT
DRAPE SURG 17X23 STRL (DRAPES) ×2 IMPLANT
DRILL 2.0 LNG QUICK RELEASE (BIT) ×2
GAUZE XEROFORM 1X8 LF (GAUZE/BANDAGES/DRESSINGS) ×2 IMPLANT
GLOVE BIO SURGEON STRL SZ 6.5 (GLOVE) ×1 IMPLANT
GLOVE BIO SURGEON STRL SZ7.5 (GLOVE) ×2 IMPLANT
GLOVE BIOGEL PI IND STRL 7.0 (GLOVE) ×1 IMPLANT
GLOVE BIOGEL PI IND STRL 8 (GLOVE) ×1 IMPLANT
GLOVE BIOGEL PI IND STRL 8.5 (GLOVE) IMPLANT
GLOVE BIOGEL PI INDICATOR 7.0 (GLOVE) ×1
GLOVE BIOGEL PI INDICATOR 8 (GLOVE) ×1
GLOVE BIOGEL PI INDICATOR 8.5 (GLOVE)
GLOVE SURG ORTHO 8.0 STRL STRW (GLOVE) IMPLANT
GOWN PREVENTION PLUS XLARGE (GOWN DISPOSABLE) ×2 IMPLANT
GOWN STRL REIN XL XLG (GOWN DISPOSABLE) ×2 IMPLANT
GRAFT BNE CANC CHIPS 1-8 5CC (Bone Implant) IMPLANT
GUIDEWIRE ORTHO 0.054X6 (WIRE) ×6 IMPLANT
NDL HYPO 25X1 1.5 SAFETY (NEEDLE) IMPLANT
NEEDLE HYPO 22GX1.5 SAFETY (NEEDLE) IMPLANT
NEEDLE HYPO 25X1 1.5 SAFETY (NEEDLE) IMPLANT
NS IRRIG 1000ML POUR BTL (IV SOLUTION) ×2 IMPLANT
PACK BASIN DAY SURGERY FS (CUSTOM PROCEDURE TRAY) ×2 IMPLANT
PAD CAST 3X4 CTTN HI CHSV (CAST SUPPLIES) ×1 IMPLANT
PAD CAST 4YDX4 CTTN HI CHSV (CAST SUPPLIES) IMPLANT
PADDING CAST ABS 4INX4YD NS (CAST SUPPLIES) ×1
PADDING CAST ABS COTTON 4X4 ST (CAST SUPPLIES) ×1 IMPLANT
PADDING CAST COTTON 3X4 STRL (CAST SUPPLIES) ×2
PADDING CAST COTTON 4X4 STRL (CAST SUPPLIES)
PLATE ACULOCK 2 NARROW RT (Plate) ×1 IMPLANT
SCREW 3.5MMX10.0MM (Screw) ×1 IMPLANT
SCREW 3.5MMX12.0MM (Screw) ×2 IMPLANT
SCREW BN FT 16X2.3XLCK HEX CRT (Screw) IMPLANT
SCREW CORT FT 18X2.3XLCK HEX (Screw) IMPLANT
SCREW CORTICAL LOCKING 2.3X16M (Screw) ×2 IMPLANT
SCREW CORTICAL LOCKING 2.3X18M (Screw) ×8 IMPLANT
SCREW CORTICAL LOCKING 2.3X20M (Screw) ×2 IMPLANT
SCREW FX18X2.3XSMTH LCK NS CRT (Screw) IMPLANT
SCREW FX20X2.3XSMTH LCK NS CRT (Screw) IMPLANT
SLEEVE SCD COMPRESS KNEE MED (MISCELLANEOUS) IMPLANT
SPLINT PLASTER CAST XFAST 3X15 (CAST SUPPLIES) IMPLANT
SPLINT PLASTER CAST XFAST 4X15 (CAST SUPPLIES) IMPLANT
SPLINT PLASTER XTRA FAST SET 4 (CAST SUPPLIES)
SPLINT PLASTER XTRA FASTSET 3X (CAST SUPPLIES) ×1
SPONGE GAUZE 4X4 12PLY (GAUZE/BANDAGES/DRESSINGS) ×2 IMPLANT
STOCKINETTE 4X48 STRL (DRAPES) ×2 IMPLANT
SUCTION FRAZIER TIP 10 FR DISP (SUCTIONS) IMPLANT
SUT ETHILON 3 0 PS 1 (SUTURE) IMPLANT
SUT ETHILON 4 0 PS 2 18 (SUTURE) IMPLANT
SUT VIC AB 3-0 PS1 18 (SUTURE)
SUT VIC AB 3-0 PS1 18XBRD (SUTURE) IMPLANT
SUT VICRYL 4-0 PS2 18IN ABS (SUTURE) ×2 IMPLANT
SYR BULB 3OZ (MISCELLANEOUS) ×2 IMPLANT
SYR CONTROL 10ML LL (SYRINGE) IMPLANT
TOWEL OR 17X24 6PK STRL BLUE (TOWEL DISPOSABLE) ×4 IMPLANT
TUBE CONNECTING 20X1/4 (TUBING) IMPLANT
UNDERPAD 30X30 INCONTINENT (UNDERPADS AND DIAPERS) ×2 IMPLANT
WATER STERILE IRR 1000ML POUR (IV SOLUTION) ×1 IMPLANT

## 2011-08-15 NOTE — Discharge Instructions (Addendum)
[facility] Instructions °Hand Surgery ° °Wound Care: °Keep your hand elevated above the level of your heart.  Do not allow it to dangle by your side.  Keep the dressing dry and do not remove it unless your doctor advises you to do so.  He will usually change it at the time of your post-op visit.  Moving your fingers is advised to stimulate circulation but will depend on the site of your surgery.  If you have a splint applied, your doctor will advise you regarding movement. ° °Activity: °Do not drive or operate machinery today.  Rest today and then you may return to your normal activity and work as indicated by your physician. ° °Diet:  °Drink liquids today or eat a light diet.  You may resume a regular diet tomorrow.   ° °General expectations: °Pain for two to three days. °Fingers may become slightly swollen. ° °Call your doctor if any of the following occur: °Severe pain not relieved by pain medication. °Elevated temperature. °Dressing soaked with blood. °Inability to move fingers. °White or bluish color to fingers. ° ° °Post Anesthesia Home Care Instructions ° °Activity: °Get plenty of rest for the remainder of the day. A responsible adult should stay with you for 24 hours following the procedure.  °For the next 24 hours, DO NOT: °-Drive a car °-Operate machinery °-Drink alcoholic beverages °-Take any medication unless instructed by your physician °-Make any legal decisions or sign important papers. ° °Meals: °Start with liquid foods such as gelatin or soup. Progress to regular foods as tolerated. Avoid greasy, spicy, heavy foods. If nausea and/or vomiting occur, drink only clear liquids until the nausea and/or vomiting subsides. Call your physician if vomiting continues. ° °Special Instructions/Symptoms: °Your throat may feel dry or sore from the anesthesia or the breathing tube placed in your throat during surgery. If this causes discomfort, gargle with warm salt water. The discomfort should disappear within 24  hours. ° ° °Regional Anesthesia Blocks ° °1. Numbness or the inability to move the "blocked" extremity may last from 3-48 hours after placement. The length of time depends on the medication injected and your individual response to the medication. If the numbness is not going away after 48 hours, call your surgeon. ° °2. The extremity that is blocked will need to be protected until the numbness is gone and the  Strength has returned. Because you cannot feel it, you will need to take extra care to avoid injury. Because it may be weak, you may have difficulty moving it or using it. You may not know what position it is in without looking at it while the block is in effect. ° °3. For blocks in the legs and feet, returning to weight bearing and walking needs to be done carefully. You will need to wait until the numbness is entirely gone and the strength has returned. You should be able to move your leg and foot normally before you try and bear weight or walk. You will need someone to be with you when you first try to ensure you do not fall and possibly risk injury. ° °4. Bruising and tenderness at the needle site are common side effects and will resolve in a few days. ° °5. Persistent numbness or new problems with movement should be communicated to the surgeon or the [facility] ([phone])/ [facility] ([phone]). °

## 2011-08-15 NOTE — Transfer of Care (Signed)
Immediate Anesthesia Transfer of Care Note  Patient: Jessica Fitzgerald  Procedure(s) Performed: Procedure(s) (LRB): OPEN REDUCTION INTERNAL FIXATION (ORIF) DISTAL RADIAL FRACTURE (Right)  Patient Location: PACU  Anesthesia Type: GA combined with regional for post-op pain  Level of Consciousness: sedated  Airway & Oxygen Therapy: Patient Spontanous Breathing and Patient connected to face mask oxygen  Post-op Assessment: Report given to PACU RN and Post -op Vital signs reviewed and stable  Post vital signs: Reviewed and stable  Complications: No apparent anesthesia complications

## 2011-08-15 NOTE — H&P (Signed)
  Jessica Fitzgerald is an 51 y.o. female.   Chief Complaint: wrist fracture HPI: 51 yo rhd female fell on right wrist two weeks ago.  Seen at [facility] and placed in splint.  [place], then returned for followup.  XR showed dorsal angulation of fracture.  Past Medical History  Diagnosis Date  . Depression   . Bipolar 1 disorder     Past Surgical History  Procedure Date  . Dilation and curettage of uterus   . Tubal ligation   . Abdominal hysterectomy   . Fracture surgery 2004    lt tibial,lt forearm,skull  . Ovarian cyst surgery 2005  . Wound debridement 2004    vac placement-faciotomy-lt forearm    History reviewed. No pertinent family history. Social History:  reports that she has never smoked. She does not have any smokeless tobacco history on file. She reports that she does not drink alcohol or use illicit drugs.  Allergies:  Allergies  Allergen Reactions  . Sulfonamide Derivatives     REACTION: Respitory Problems and Rash    Medications Prior to Admission  Medication Dose Route Frequency Provider Last Rate Last Dose  . fentaNYL (SUBLIMAZE) injection 50-100 mcg  50-100 mcg Intravenous PRN Hart Robinsons, MD      . lactated ringers infusion   Intravenous Continuous Bedelia Person, MD 10 mL/hr at 08/15/11 0825    . midazolam (VERSED) injection 0.5-2 mg  0.5-2 mg Intravenous PRN Hart Robinsons, MD       No current outpatient prescriptions on file as of 08/15/2011.    Results for orders placed during the hospital encounter of 08/15/11 (from the past 48 hour(s))  POCT HEMOGLOBIN-HEMACUE     Status: Normal   Collection Time   08/15/11  8:29 AM      Component Value Range Comment   Hemoglobin 13.0  12.0 - 15.0 (g/dL)     No results found.   A comprehensive review of systems was negative except for: Behavioral/Psych: positive for depression  Blood pressure 111/82, pulse 67, temperature 98 F (36.7 C), temperature source Oral, resp. rate 16, height 5'  5" (1.651 m), weight 65.772 kg (145 lb), SpO2 96.00%.  General appearance: alert, cooperative and appears stated age Head: Normocephalic, without obvious abnormality, atraumatic Neck: supple, symmetrical, trachea midline Resp: clear to auscultation bilaterally Cardio: regular rate and rhythm GI: soft, non-tender; bowel sounds normal; no masses,  no organomegaly Extremities: light touch sensation and capillary refill intact all digits.  +epl/fpl/io.  skin intact. Pulses: 2+ and symmetric Skin: Skin color, texture, turgor normal. No rashes or lesions Neurologic: Grossly normal Incision/Wound: na  Assessment/Plan Right distal radius fracture with dorsal angulation.  Discussed operative and non operative treatment options with patient.  She wishes to have surgical treatment.  Risks, benefits, and alternatives of surgery were discussed and the patient agrees with the plan of care.   Wiletta Bermingham R 08/15/2011, 8:45 AM

## 2011-08-15 NOTE — Anesthesia Preprocedure Evaluation (Signed)
Anesthesia Evaluation  Patient identified by MRN, date of birth, ID band Patient awake    Reviewed: Allergy & Precautions, H&P , NPO status , Patient's Chart, lab work & pertinent test results, reviewed documented beta blocker date and time   Airway Mallampati: II TM Distance: >3 FB Neck ROM: full    Dental   Pulmonary neg pulmonary ROS,          Cardiovascular negative cardio ROS      Neuro/Psych PSYCHIATRIC DISORDERS negative neurological ROS     GI/Hepatic negative GI ROS, Neg liver ROS,   Endo/Other  negative endocrine ROS  Renal/GU negative Renal ROS  negative genitourinary   Musculoskeletal   Abdominal   Peds  Hematology negative hematology ROS (+)   Anesthesia Other Findings See surgeon's H&P   Reproductive/Obstetrics negative OB ROS                           Anesthesia Physical Anesthesia Plan  ASA: II  Anesthesia Plan: General   Post-op Pain Management:    Induction: Intravenous  Airway Management Planned: LMA  Additional Equipment:   Intra-op Plan:   Post-operative Plan: Extubation in OR  Informed Consent: I have reviewed the patients History and Physical, chart, labs and discussed the procedure including the risks, benefits and alternatives for the proposed anesthesia with the patient or authorized representative who has indicated his/her understanding and acceptance.     Plan Discussed with: CRNA and Surgeon  Anesthesia Plan Comments:         Anesthesia Quick Evaluation

## 2011-08-15 NOTE — Brief Op Note (Signed)
08/15/2011  11:37 AM  PATIENT:  Jessica Fitzgerald  51 y.o. female  PRE-OPERATIVE DIAGNOSIS:  right distal radius fracture  POST-OPERATIVE DIAGNOSIS:  right distal radius fracture  PROCEDURE:  Procedure(s) (LRB): OPEN REDUCTION INTERNAL FIXATION (ORIF) DISTAL RADIAL FRACTURE (Right)  SURGEON:  Surgeon(s) and Role:    * Tami Ribas, MD - Primary  PHYSICIAN ASSISTANT:   ASSISTANTS: none   ANESTHESIA:   regional and general  EBL:  Total I/O In: 300 [I.V.:300] Out: -   BLOOD ADMINISTERED:none  DRAINS: none   LOCAL MEDICATIONS USED:  NONE  SPECIMEN:  No Specimen  DISPOSITION OF SPECIMEN:  N/A  COUNTS:  YES  TOURNIQUET:   Total Tourniquet Time Documented: Upper Arm (Right) - 60 minutes  DICTATION: .Other Dictation: Dictation Number [id number]  PLAN OF CARE: Discharge to home after PACU  PATIENT DISPOSITION:  PACU - hemodynamically stable.

## 2011-08-15 NOTE — Anesthesia Procedure Notes (Addendum)
Anesthesia Regional Block:  Supraclavicular block  Pre-Anesthetic Checklist: ,, timeout performed, Correct Patient, Correct Site, Correct Laterality, Correct Procedure, Correct Position, site marked, Risks and benefits discussed,  Surgical consent,  Pre-op evaluation,  At surgeon's request and post-op pain management  Laterality: Right  Prep: chloraprep       Needles:   Needle Type: Other   (Arrow Echogenic)   Needle Length: 9cm  Needle Gauge: 21    Additional Needles:  Procedures: ultrasound guided Supraclavicular block Narrative:  Start time: 08/15/2011 8:55 AM End time: 08/15/2011 9:02 AM Injection made incrementally with aspirations every 5 mL.  Performed by: Personally  Anesthesiologist: C Frederick  Additional Notes: Ultrasound guidance used to: id relevant anatomy, confirm needle position, local anesthetic spread, avoidance of vascular puncture. Picture saved. No complications. Block performed personally by Janetta Hora. Gelene Mink, MD    Supraclavicular block Procedure Name: LMA Insertion Date/Time: 08/15/2011 10:26 AM Performed by: Verlan Friends Pre-anesthesia Checklist: Patient identified, Emergency Drugs available, Suction available, Patient being monitored and Timeout performed Patient Re-evaluated:Patient Re-evaluated prior to inductionOxygen Delivery Method: Circle System Utilized Preoxygenation: Pre-oxygenation with 100% oxygen Intubation Type: IV induction Ventilation: Mask ventilation without difficulty LMA: LMA inserted LMA Size: 4.0 Number of attempts: 1 (atraumatic) Airway Equipment and Method: bite block (rt. bite gard used) Placement Confirmation: positive ETCO2 Tube secured with: Tape Dental Injury: Teeth and Oropharynx as per pre-operative assessment

## 2011-08-15 NOTE — Anesthesia Postprocedure Evaluation (Signed)
Anesthesia Post Note  Patient: Jessica Fitzgerald  Procedure(s) Performed: Procedure(s) (LRB): OPEN REDUCTION INTERNAL FIXATION (ORIF) DISTAL RADIAL FRACTURE (Right)  Anesthesia type: General  Patient location: PACU  Post pain: Pain level controlled  Post assessment: Patient's Cardiovascular Status Stable  Last Vitals:  Filed Vitals:   08/15/11 1248  BP: 113/70  Pulse: 70  Temp: 36.4 C  Resp: 16    Post vital signs: Reviewed and stable  Level of consciousness: alert  Complications: No apparent anesthesia complications

## 2011-08-15 NOTE — Progress Notes (Signed)
Assisted Dr. Frederick with right, ultrasound guided, supraclavicular block. Side rails up, monitors on throughout procedure. See vital signs in flow sheet. Tolerated Procedure well. 

## 2011-08-15 NOTE — Op Note (Signed)
Dictation [id number]

## 2011-08-16 NOTE — Op Note (Signed)
Jessica Fitzgerald, Jessica Fitzgerald              ACCOUNT NO.:  [id number]  MEDICAL RECORD NO.:  [id number]  LOCATION:                                 FACILITY:  PHYSICIAN:  Betha Loa, MD             DATE OF BIRTH:  DATE OF PROCEDURE:  08/15/2011 DATE OF DISCHARGE:                              OPERATIVE REPORT   PREOPERATIVE DIAGNOSIS:  Right distal radius fracture.  POSTOPERATIVE DIAGNOSIS:  Right distal radius fracture.  PROCEDURE:  Open reduction internal fixation of right distal radius fracture.  SURGEON:  Betha Loa, MD.  ASSISTANT:  None.  ANESTHESIA:  General.  IV FLUIDS:  Per anesthesia flow sheet.  ESTIMATED BLOOD LOSS:  Minimal.  COMPLICATIONS:  None.  SPECIMENS:  None.  TOURNIQUET TIME:  59 minutes.  DISPOSITION:  Stable to PACU.  INDICATIONS:  Jessica Fitzgerald is a 51 year old, right-hand dominant female who 2 weeks ago fell on her right arm and injured it.  [place] and followed up with Dewaine Conger when she got back.  She was referred to me for further care.  On evaluation, she was noted to have intact sensation, capillary refill in all fingertips.  Radiographs showed a distal radius fracture with dorsal angulation.  Discussed the nature of the injury.  We discussed nonoperative and operative treatment measures.  Risks, benefits and alternatives of surgery were discussed including risk of blood loss, infection, damage to nerves, vessels, tendons, ligaments, bone, failure of surgery, need for additional surgery, complications, wound healing, continued pain, nonunion, malunion, stiffness.  She voiced understanding of these risks and elected to proceed.  OPERATIVE COURSE:  After being identified preoperatively by myself, the patient and I agreed upon the procedure and site of procedure.  The surgical site was marked.  The risks, benefits, alternatives of surgery were reviewed and she wished to proceed.  Surgical consent had been signed.  A regional  block was performed by anesthesia in preoperative holding.  She had been given 1 g of IV Ancef as preoperative antibiotic prophylaxis.  She was transferred to the operating room and placed on the operating table in supine position with the right upper extremity on arm board.  General anesthesia was induced by the anesthesiologist.  The right upper extremity was prepped and draped in normal sterile orthopedic fashion.  Surgical pause was performed between surgeons, anesthesia, operating staff and all were in agreement with the patient, procedure, and site of procedure.  Tourniquet at the proximal aspect of the extremity was inflated to 250 mmHg after exsanguination of the limb with an Esmarch bandage.  Standard volar Sherilyn Cooter approach was used.  The superficial and deep portions.  The FCR tendon sheath were incised.  The FCR and FPL were retracted ulnarly to protect the palmar cutaneous branch of the median nerve.  The brachioradialis was released from the radial side of the radius.  The quadratus was released and elevated using a periosteal elevator.  The fracture site was identified.  It was freed up using the knife blade and Therapist, nutritional.  It was cleared of early fibrous tissue on the end of the bone.  C-arm was  used in AP, lateral, oblique projections to ensure that appropriate reduction could be obtained.  There was some bone loss.  Cancellous bone chips were then packed into the fracture site.  This filled the void.  A narrow volar distal radial locking plate was selected from the Acumed set.  This was secured using the guide pins.  Care was taken to ensure appropriate positioning of the plate.  Standard AO drilling and measuring technique was used.  A screw was placed in the slotted hole in the shaft of the plate.  The distal screw holes were filled using all smooth pegs with the exception of 2 radial styloid holes, which were filled using locking screws.  The C-arm was used in AP and  lateral projections to ensure appropriate position of hardware and reduction of the fracture, which was the case.  The remaining holes in the shaft of the plate were filled.  The C-arm was used for final radiographs.  There was no intraarticular penetration.  The wound was copiously irrigated with sterile saline.  The pronator quadratus was repaired back over the top of the plate using 4-0 Vicryl suture.  Two inverted interrupted sutures were placed in subcutaneous tissues and skin was closed with 4-0 nylon in a horizontal mattress fashion.  The wounds were dressed with sterile Xeroform and 4x4s and a Kerlix bandage.  A volar splint was placed and wrapped with Kerlix and Ace bandage.  Tourniquet was deflated at 59 minutes.  The fingertips were pink with brisk capillary refill after deflation of tourniquet.  Operative drapes were broken down.  The patient was awoken from anesthesia safely.  She was transferred back to stretcher and taken to PACU in stable condition.  I will see her back in the office 1 week for postoperative followup.  I will give her Percocet 5/325 1-2 p.o. q.6 hours p.r.n. pain, dispensed number 40.     Betha Loa, MD     KK/MEDQ  D:  08/15/2011  T:  08/16/2011  Job:  [id number]

## 2012-02-19 ENCOUNTER — Ambulatory Visit: Payer: PRIVATE HEALTH INSURANCE | Admitting: Family Medicine

## 2012-08-04 ENCOUNTER — Telehealth: Payer: Self-pay | Admitting: Internal Medicine

## 2012-08-04 NOTE — Telephone Encounter (Signed)
Yes, ok to schedule

## 2012-08-04 NOTE — Telephone Encounter (Signed)
Pt would like to schedule a CPE but pt hasn't been seen by Alwyn Ren since 2011- would it be ok to schedule CPE?

## 2012-09-30 ENCOUNTER — Ambulatory Visit (INDEPENDENT_AMBULATORY_CARE_PROVIDER_SITE_OTHER): Payer: BC Managed Care – PPO | Admitting: Internal Medicine

## 2012-09-30 ENCOUNTER — Encounter: Payer: Self-pay | Admitting: Internal Medicine

## 2012-09-30 VITALS — BP 122/84 | HR 74 | Temp 98.6°F | Resp 12 | Ht 65.0 in | Wt 159.0 lb

## 2012-09-30 DIAGNOSIS — Z0000 Encounter for general adult medical examination without abnormal findings: Secondary | ICD-10-CM

## 2012-09-30 NOTE — Progress Notes (Signed)
  Subjective:    Patient ID: Jessica Fitzgerald, female    DOB: 05/07/1960, 52 y.o.   MRN: [id number]  HPI  Selena Batten is here for a physical;acute issues include recurrent fatigue.     Review of Systems She is on a low sugar diet; she exercises as yoga 60 minutes 3 times per week without symptoms. Specifically she denies chest pain, palpitations, dyspnea, or claudication.  Remotely she was questioned as having borderline sleep apnea. She has excessive daytime sleepiness except despite Nuvigil & 9 shots of expresso/day . She has morning stiffness; she is concerned she has fibromyalgia. Tramadol helps myalgias.    Objective:   Physical Exam  Gen.: Adequately nourished in appearance. Alert, appropriate and cooperative throughout exam. Head: Normocephalic without obvious abnormalities; no alopecia  Eyes: No corneal or conjunctival inflammation noted. Pupils equal round reactive to light and accommodation.  Extraocular motion intact. Vision grossly normal without lenses. No lid lag or proptosis Ears: External  ear exam reveals no significant lesions or deformities. Wax on R; L TM normal. Hearing is grossly normal bilaterally. Nose: External nasal exam reveals no deformity or inflammation. Nasal mucosa are pink and moist. No lesions or exudates noted.  Mouth: Oral mucosa and oropharynx reveal no lesions or exudates. Teeth in good repair. Neck: No deformities, masses, or tenderness noted. Range of motion & Thyroid normal. Lungs: Normal respiratory effort; chest expands symmetrically. Lungs are clear to auscultation without rales, wheezes, or increased work of breathing. Heart: Normal rate and rhythm. Normal S1 and S2. No gallop, click, or rub. S4 w/o murmur. Abdomen: Bowel sounds normal; abdomen soft and nontender. No masses, organomegaly or hernias noted.                               Musculoskeletal/extremities: No deformity or scoliosis noted of  the thoracic or lumbar spine.  No clubbing, cyanosis,  edema, or significant extremity  deformity noted. Range of motion normal .Tone & strength  Normal. Joints minor DIP changes . Nail health good. Able to lie down & sit up w/o help. Negative SLR bilaterally Vascular: Carotid, radial artery, dorsalis pedis and  posterior tibial pulses are full and equal. No bruits present. Neurologic: Alert and oriented x3. Deep tendon reflexes symmetrical and normal.          Skin: Intact without suspicious lesions or rashes. Lymph: No cervical, axillary lymphadenopathy present. Psych: Mood and affect are normal. Normally interactive                                                                                        Assessment & Plan:  #1 comprehensive physical exam; no acute findings #2 fatigue; if labs are negative sleep apnea study should be pursued because of the long-term potential adverse effects of untreated sleep apnea  #3 myalgias Plan: see Orders  & Recommendations

## 2012-09-30 NOTE — Patient Instructions (Addendum)
Please  schedule fasting Labs : BMET,Lipids, hepatic panel, CBC & dif, TSH, sed rate, CK. PLEASE BRING THESE INSTRUCTIONS TO FOLLOW UP  LAB APPOINTMENT.This will guarantee correct labs are drawn, eliminating need for repeat blood sampling ( needle sticks ! ). Diagnoses /Codes: V70.0.  If you activate the  My Chart system; lab & Xray results will be released directly  to you as soon as I review & address these through the computer. If you choose not to sign up for My Chart within 36 hours of labs being drawn; results will be reviewed & interpretation added before being copied & mailed, causing a delay in getting the results to you.If you do not receive that report within 7-10 days ,please call. Additionally you can use this system to gain direct  access to your records  if  out of town or @ an office of a  physician who is not in  the My Chart network.  This improves continuity of care & places you in control of your medical record.

## 2012-10-02 ENCOUNTER — Other Ambulatory Visit (INDEPENDENT_AMBULATORY_CARE_PROVIDER_SITE_OTHER): Payer: BC Managed Care – PPO

## 2012-10-02 DIAGNOSIS — Z0000 Encounter for general adult medical examination without abnormal findings: Secondary | ICD-10-CM

## 2012-10-02 LAB — BASIC METABOLIC PANEL
CO2: 27 mEq/L (ref 19–32)
Chloride: 105 mEq/L (ref 96–112)
Creatinine, Ser: 0.9 mg/dL (ref 0.4–1.2)
Potassium: 5.2 mEq/L — ABNORMAL HIGH (ref 3.5–5.1)

## 2012-10-02 LAB — CK: Total CK: 113 U/L (ref 7–177)

## 2012-10-02 LAB — HEPATIC FUNCTION PANEL
ALT: 21 U/L (ref 0–35)
AST: 20 U/L (ref 0–37)
Alkaline Phosphatase: 68 U/L (ref 39–117)
Bilirubin, Direct: 0 mg/dL (ref 0.0–0.3)
Total Protein: 7 g/dL (ref 6.0–8.3)

## 2012-10-02 LAB — CBC WITH DIFFERENTIAL/PLATELET
Basophils Absolute: 0 10*3/uL (ref 0.0–0.1)
Eosinophils Absolute: 0.1 10*3/uL (ref 0.0–0.7)
Lymphocytes Relative: 37.5 % (ref 12.0–46.0)
MCHC: 33.6 g/dL (ref 30.0–36.0)
Neutrophils Relative %: 49.8 % (ref 43.0–77.0)
RDW: 13.9 % (ref 11.5–14.6)

## 2012-10-02 LAB — SEDIMENTATION RATE: Sed Rate: 31 mm/hr — ABNORMAL HIGH (ref 0–22)

## 2012-10-06 ENCOUNTER — Telehealth: Payer: Self-pay | Admitting: *Deleted

## 2012-10-06 NOTE — Telephone Encounter (Signed)
Pt return call stating that she missed our call and would like to know if her labs were normal. Called Pt back left detail message of hopp comments and advise Pt that labs have been mailed as well. Pt to return call with any additional concerns

## 2012-10-06 NOTE — Telephone Encounter (Signed)
Pt left VM that she would like to get result of labs done 10-02-12. Per documentation letter mailed with results on 10-05-12. Left message to call office to advise Pt of labs.   Notes Recorded by Pecola Lawless, MD on 10/03/2012 at 11:30 AM Sedimentation rate measures inflammation; this minimal elevation is not considered significant. CK would be elevated with any muscle injury. There are no other significant lab abnormalities except for the LDL or bad cholesterol of 578.4. To optimally assess your long term risk; I recommend an advanced cholesterol panel after 12 weeks of nutrition and exercise change. Please review Dr Gildardo Griffes book Eat, Drink & Be Healthy for best dietary cholesterol information. Cardiovascular exercise, this can be as simple a program as walking, is recommended 30-45 minutes 3-4 times per week. If you're not exercising you should take 6-8 weeks to build up to this level.  Please schedule fasting Labs : NMR Lipoprofile Lipid Panel after 3 months of these dietary & exercise changes . PLEASE BRING THESE INSTRUCTIONS TO FOLLOW UP LAB APPOINTMENT.This will guarantee correct labs are drawn, eliminating need for repeat blood sampling ( needle sticks ! ). Diagnoses /Codes: 272.4. Please consider scheduling a screening colonoscopy based on standard of care which we discussed @ your physical.Share results with all non [facility] medical staff seen . [employer]

## 2012-12-07 ENCOUNTER — Institutional Professional Consult (permissible substitution): Payer: Self-pay | Admitting: Neurology

## 2013-01-19 ENCOUNTER — Institutional Professional Consult (permissible substitution): Payer: Self-pay | Admitting: Neurology

## 2013-02-17 ENCOUNTER — Encounter: Payer: Self-pay | Admitting: Neurology

## 2013-02-17 ENCOUNTER — Ambulatory Visit (INDEPENDENT_AMBULATORY_CARE_PROVIDER_SITE_OTHER): Payer: BC Managed Care – PPO | Admitting: Neurology

## 2013-02-17 VITALS — BP 117/81 | HR 93 | Temp 98.8°F | Ht 64.0 in | Wt 170.0 lb

## 2013-02-17 DIAGNOSIS — G4733 Obstructive sleep apnea (adult) (pediatric): Secondary | ICD-10-CM

## 2013-02-17 NOTE — Progress Notes (Signed)
Subjective:    Patient ID: Jessica Fitzgerald is a 52 y.o. female.  HPI  Huston Foley, MD, PhD [facility] [address] [address] [place], [place] [address]  Dear Dr. Nolen Mu,  I saw your patient, Jessica Fitzgerald, upon your kind request in my neurologic clinic today for initial consultation of her sleep disorder, in particular her prior diagnosis of obstructive sleep apnea. The patient is unaccompanied today. As you know, Jessica Fitzgerald is a very pleasant 52 year old right-handed woman with an underlying history of BPAD and insomnia, who was diagnosed with obstructive sleep apnea some 10 years ago but at that time she did not want to pursue CPAP therapy. I reviewed her sleep test report from 05/08/2009 which was read by Dr. Craige Cotta: Sleep efficiency was 87% with a latency to sleep of 34 minutes. REM latency was prolonged at 189 minutes. She had a total AHI of 5 per hour. Loud snoring was noted. Her REM related AHI was 4, her supine AHI was 5.2. Her oxyhemoglobin desaturation nadir was 91%.  Her BT is 8 PM and she falls asleep within 15 min to 1 h and she wakes up at 5 AM. She has gained in the realm of 20 lb since her last sleep study. She denies RLS Sx and does not kick in her sleep. She has a FHx of Bipolar, no RLS/OSA/narcolepsy. She wakes up a few times in the night. She has had sleep onset insomnia for many years and has been on Ambien and Trazodone. She has cut back on caffeine, but still drinks about 5 espressos a day, down from around 10/day. She used to chew Nicorette gum, but stopped. She drinks no EtOH. She feels very sleepy and this has been going on for 1 1/2 years. Her ESS is 19/24. She was on Nuvigil for about 2 years and stopped about 2 month. It became ineffective, but she had "terrible withdrawals".   She admits to occasional morning headaches.   She has not fallen asleep while driving. The patient has not been taking a planned nap, but falls asleep during  the day several times. 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 She has been known to snore for the past many years. Snoring is reportedly severe, and associated with choking sounds and witnessed apneas. She wakes up with a dry mouth and from her own snoring.   She is not a very restless sleeper.   She denies cataplexy, sleep paralysis, hypnagogic or hypnopompic hallucinations, or sleep attacks. She does not report any vivid dreams, nightmares, dream enactments, or parasomnias, such as sleep talking or sleep walking. Her bedroom is usually dark and cool. There is no TV in the bedroom.   Her Past Medical History Is Significant For: Past Medical History  Diagnosis Date  . Depression   . Bipolar 1 disorder   . Recurrent binge eating   . Fatigue   . Sleep apnea     ? borderline    Her Past Surgical History Is Significant For: Past Surgical History  Procedure Laterality Date  . Dilation and curettage of uterus    . Tubal ligation    . Abdominal hysterectomy    . Fracture surgery  2004    lt tibial,lt forearm,skull  . Ovarian cyst surgery  2005  . Wound debridement  2004    vac placement-faciotomy-lt forearm  . Tummy tuck  2012    Her Family History Is Significant For: Family History  Problem Relation  Age of Onset  . Depression Mother      ? Bipolar   . Stroke Father 81  . Atrial fibrillation Sister   . Depression Maternal Grandmother     Bipolar disorder  . Cancer Neg Hx   . Diabetes Neg Hx   . Depression Father   . Parkinson's disease Father     Her Social History Is Significant For: History   Social History  . Marital Status: Single    Spouse Name: N/A    Number of Children: N/A  . Years of Education: N/A   Social History Main Topics  . Smoking status: Not on file  . Smokeless tobacco: Not on file  . Alcohol Use: No     Comment:  Quit 2003 & 2006  . Drug Use: No  . Sexual Activity: Yes   Other Topics Concern  . Not on file   Social History Narrative  . No  narrative on file    Her Allergies Are:  Allergies  Allergen Reactions  . Sulfonamide Derivatives     REACTION: Respitory Problems and Rash  :   Her Current Medications Are:  Outpatient Encounter Prescriptions as of 02/17/2013  Medication Sig Dispense Refill  . ARIPiprazole (ABILIFY) 10 MG tablet Take 10 mg by mouth every evening.      Marland Kitchen buPROPion (WELLBUTRIN XL) 150 MG 24 hr tablet Take 150 mg by mouth daily.      Marland Kitchen desvenlafaxine (PRISTIQ) 50 MG 24 hr tablet Take 50 mg by mouth every evening.      . lamoTRIgine (LAMICTAL) 200 MG tablet Take 200 mg by mouth daily.      . traZODone (DESYREL) 150 MG tablet Take 150 mg by mouth at bedtime.      Marland Kitchen zolpidem (AMBIEN) 10 MG tablet Take 10 mg by mouth at bedtime as needed for sleep.      . [DISCONTINUED] Armodafinil (NUVIGIL) 250 MG tablet Take 250 mg by mouth daily.       No facility-administered encounter medications on file as of 02/17/2013.   Review of Systems:  Out of a complete 14 point review of systems, all are reviewed and negative with the exception of these symptoms as listed below:  Review of Systems  Constitutional: Positive for fatigue.       Weight gain  Psychiatric/Behavioral: Positive for sleep disturbance (too much sleep).       Depression Anxiety Decreased energy Change in appetite Disinterest in activites     Objective:  Neurologic Exam  Physical Exam Physical Examination:   Filed Vitals:   02/17/13 0827  BP: 117/81  Pulse: 93  Temp: 98.8 F (37.1 C)   General Examination: The patient is a very pleasant 52 y.o. female in no acute distress. She appears well-developed and well-nourished and well groomed.   HEENT: Normocephalic, atraumatic, pupils are equal, round and reactive to light and accommodation. Funduscopic exam is normal with sharp disc margins noted. Extraocular tracking is good without limitation to gaze excursion or nystagmus noted. Normal smooth pursuit is noted. Hearing is grossly intact.  Tympanic membranes are clear bilaterally. Face is symmetric with normal facial animation and normal facial sensation. Speech is clear with no dysarthria noted. There is no hypophonia. There is no lip, neck/head, jaw or voice tremor. Neck is supple with full range of passive and active motion. There are no carotid bruits on auscultation. Oropharynx exam reveals: mild mouth dryness, adequate dental hygiene and mild airway crowding, due to tonsils and narrow  airway entry. Mallampati is class I. Tongue protrudes centrally and palate elevates symmetrically. Tonsils are 1+ to 2+. Neck size is 13 7/8.   Chest: Clear to auscultation without wheezing, rhonchi or crackles noted.  Heart: S1+S2+0, regular and normal without murmurs, rubs or gallops noted.   Abdomen: Soft, non-tender and non-distended with normal bowel sounds appreciated on auscultation.  Extremities: There is no pitting edema in the distal lower extremities bilaterally. Pedal pulses are intact.  Skin: Warm and dry without trophic changes noted. There are no varicose veins.  Musculoskeletal: exam reveals no obvious joint deformities, tenderness or joint swelling or erythema.   Neurologically:  Mental status: The patient is awake, alert and oriented in all 4 spheres. Her memory, attention, language and knowledge are appropriate. There is no aphasia, agnosia, apraxia or anomia. Speech is clear with normal prosody and enunciation. Thought process is linear. Mood is congruent and affect is blunted.  Cranial nerves are as described above under HEENT exam. In addition, shoulder shrug is normal with equal shoulder height noted. Motor exam: Normal bulk, strength and tone is noted. There is no drift, tremor or rebound. Romberg is negative. Reflexes are 2+ throughout. Toes are downgoing bilaterally. Fine motor skills are intact with normal finger taps, normal hand movements, normal rapid alternating patting, normal foot taps and normal foot agility.   Cerebellar testing shows no dysmetria or intention tremor on finger to nose testing. Heel to shin is unremarkable bilaterally. There is no truncal or gait ataxia.  Sensory exam is intact to light touch, pinprick, vibration, temperature sense and proprioception in the upper and lower extremities.  Gait, station and balance are unremarkable. No veering to one side is noted. No leaning to one side is noted. Posture is age-appropriate and stance is narrow based. No problems turning are noted. She turns en bloc. Tandem walk is unremarkable. Intact toe and heel stance is noted.               Assessment and Plan:   In summary, Jessica Fitzgerald is a very pleasant 52 y.o.-year old female with a history and physical exam concerning for obstructive sleep apnea (OSA). I had a long chat with the patien about my findings and the diagnosis, its prognosis and treatment options. We talked about medical treatments and non-pharmacological approaches. I explained in particular the risks and ramifications of untreated moderate to severe OSA, especially with respect to developing cardiovascular disease down the Road, including congestive heart failure, difficult to treat hypertension, cardiac arrhythmias, or stroke. Even type 2 diabetes has in part been linked to untreated OSA. We talked about trying to maintain a healthy lifestyle in general, as well as the importance of weight control. I encouraged the patient to eat healthy, exercise daily and keep well hydrated, to keep a scheduled bedtime and wake time routine, to not skip any meals and eat healthy snacks in between meals.  I recommended the following at this time: sleep study with potential positive airway pressure titration.  I explained the sleep test procedure to the patient and also outlined possible surgical and non-surgical treatment options of OSA, including the use of a custom-made dental device, upper airway surgical options, such as pillar implants,  radiofrequency surgery, tongue base surgery, and UPPP. I also explained the CPAP treatment option to the patient, who indicated that she would be reluctant, but willing to try CPAP if the need arises. She would prefer surgery for OSA over CPAP, but I have asked to consider CPAP.  I explained the importance of being compliant with PAP treatment, not only for insurance purposes but primarily to improve Her symptoms, and for the patient's long term health benefit, including to reduce Her cardiovascular risks. I answered all her questions today and the patient was in agreement. I would like to see her back after the sleep study is completed and encouraged her to call with any interim questions, concerns, problems or updates.   Thank you very much for allowing me to participate in the care of this nice patient. If I can be of any further assistance to you please do not hesitate to call me at [phone].  Sincerely,   Huston Foley, MD, PhD

## 2013-02-17 NOTE — Patient Instructions (Addendum)
Based on your symptoms and your exam I believe you are at risk for obstructive sleep apnea or OSA, and I think we should proceed with a sleep study to determine whether you do or do not have OSA and how severe it is. If you have more than mild OSA, I want you to consider treatment with CPAP. Please remember, the risks and ramifications of moderate to severe obstructive sleep apnea or OSA are: Cardiovascular disease, including congestive heart failure, stroke, difficult to control hypertension, arrhythmias, and even type 2 diabetes has been linked to untreated OSA. Sleep apnea causes disruption of sleep and sleep deprivation in most cases, which, in turn, can cause recurrent headaches, problems with memory, mood, concentration, focus, and vigilance. Most people with untreated sleep apnea report excessive daytime sleepiness, which can affect their ability to drive. Please do not drive if you feel sleepy.  I will see you back after your sleep study to go over the test results and where to go from there. We will call you after your sleep study and to set up an appointment at the time.   Please remember to try to maintain good sleep hygiene, which means: Keep a regular sleep and wake schedule, try not to exercise or have a meal within 2 hours of your bedtime, try to keep your bedroom conducive for sleep, that is, cool and dark, without light distractors such as an illuminated alarm clock, and refrain from watching TV right before sleep or in the middle of the night and do not keep the TV or radio on during the night. Also, try not to use or play on electronic devices at bedtime, such as your cell phone, tablet PC or laptop. If you like to read at bedtime on an electronic device, try to dim the background light as much as possible.  

## 2013-02-26 ENCOUNTER — Encounter: Payer: Self-pay | Admitting: Neurology

## 2013-02-28 ENCOUNTER — Ambulatory Visit (INDEPENDENT_AMBULATORY_CARE_PROVIDER_SITE_OTHER): Payer: BC Managed Care – PPO | Admitting: Neurology

## 2013-02-28 VITALS — BP 120/75 | HR 63

## 2013-02-28 DIAGNOSIS — G4761 Periodic limb movement disorder: Secondary | ICD-10-CM

## 2013-02-28 DIAGNOSIS — G4733 Obstructive sleep apnea (adult) (pediatric): Secondary | ICD-10-CM

## 2013-03-01 NOTE — Sleep Study (Signed)
Pt arrives,takes sleep medications.  Eugenio Hoes of study reveals significant degree ofOSA. Pt placedon CPAP therapy using P10 nasal pillows size medium, titrated with low pressures.  Pt did well at thesesettings.

## 2013-03-01 NOTE — Sleep Study (Signed)
See media tab for full report  

## 2013-03-12 ENCOUNTER — Telehealth: Payer: Self-pay | Admitting: Neurology

## 2013-03-12 DIAGNOSIS — G4733 Obstructive sleep apnea (adult) (pediatric): Secondary | ICD-10-CM

## 2013-03-12 NOTE — Telephone Encounter (Signed)
Please call and notify patient that the recent sleep study confirmed the diagnosis of OSA. She did well with CPAP during the study with significant improvement of the respiratory events. Therefore, I would like start the patient on CPAP at home. I placed the order in the chart.   Arrange for CPAP set up at home through a DME company of patient's choice and fax/route report to PCP and referring MD (if other than PCP).   The patient will also need a follow up appointment with me in 6-8 weeks post set up that has to be scheduled; help the patient schedule this (in a follow-up slot).   Please re-enforce the importance of compliance with treatment and the need for us to monitor compliance data.   Once you have spoken to the patient and scheduled the return appointment, you may close this encounter, thanks,   Kymari Nuon, MD, PhD [facility]    

## 2013-03-15 ENCOUNTER — Encounter: Payer: Self-pay | Admitting: *Deleted

## 2013-03-15 NOTE — Telephone Encounter (Signed)
I called and left a message for the patient to inform her that her recent sleep study did confirm the diagnosis of obstructive sleep apnea and that Dr.Athar would like for her to CPAP at home. I ask the patient to callback to the office to let us know which DME company she would like to supply her machine. I will mail a copy to the patient and fax a copy to Dr. Loralie Champagne office.

## 2013-05-11 ENCOUNTER — Encounter: Payer: Self-pay | Admitting: Neurology

## 2013-05-11 NOTE — Progress Notes (Signed)
Quick Note:  I reviewed the patient's CPAP compliance data from 04/07/2013 to 05/06/2013, which is a total of 30 days, during which time the patient used CPAP every day except for 2 days. The average usage for all days was 7 hours and 27 minutes. The percent used days greater than 4 hours was 93%, indicating excellent compliance. The residual AHI was 3.4 per hour, indicating an appropriate treatment pressure of 5 cwp with EPR off. I will review this data with the patient at the next office visit, provide feedback and additional troubleshooting if need be. I do not see an appointment pending and she may have to be contacted to schedule an appointment for follow up. Huston FoleySaima Nya Monds, MD, PhD [facility] ([facility]   ______

## 2013-05-17 ENCOUNTER — Encounter: Payer: Self-pay | Admitting: Neurology

## 2013-06-03 ENCOUNTER — Ambulatory Visit: Payer: BC Managed Care – PPO | Admitting: Neurology

## 2013-06-18 ENCOUNTER — Encounter: Payer: Self-pay | Admitting: Neurology

## 2013-06-18 NOTE — Progress Notes (Signed)
Quick Note:  I reviewed the patient's CPAP compliance data from 05/05/2013 to 06/03/2013, which is a total of 30 days, during which time the patient used CPAP every day. The average usage for all days was 7 hours and 11 minutes. The percent used days greater than 4 hours was 90 %, indicating excellent compliance. The residual AHI was 1.6 per hour, indicating an appropriate treatment pressure of 5 cwp with EPR off. I will review this data with the patient at the next office visit, which is currently scheduled for 07/15/2013 at 11:30 AM, provide feedback and additional troubleshooting if need be.  Huston FoleySaima Aranza Geddes, MD, PhD [facility]   ______

## 2013-06-23 ENCOUNTER — Encounter: Payer: Self-pay | Admitting: Neurology

## 2013-07-15 ENCOUNTER — Encounter (INDEPENDENT_AMBULATORY_CARE_PROVIDER_SITE_OTHER): Payer: Self-pay

## 2013-07-15 ENCOUNTER — Ambulatory Visit (INDEPENDENT_AMBULATORY_CARE_PROVIDER_SITE_OTHER): Payer: Self-pay | Admitting: Neurology

## 2013-07-15 VITALS — Ht 64.0 in

## 2013-07-15 DIAGNOSIS — Z5320 Procedure and treatment not carried out because of patient's decision for unspecified reasons: Secondary | ICD-10-CM

## 2013-07-16 ENCOUNTER — Encounter: Payer: Self-pay | Admitting: Neurology

## 2013-07-16 ENCOUNTER — Ambulatory Visit (INDEPENDENT_AMBULATORY_CARE_PROVIDER_SITE_OTHER): Payer: BC Managed Care – PPO | Admitting: Neurology

## 2013-07-16 VITALS — BP 135/85 | HR 69 | Temp 98.1°F | Ht 63.5 in | Wt 156.0 lb

## 2013-07-16 DIAGNOSIS — G4733 Obstructive sleep apnea (adult) (pediatric): Secondary | ICD-10-CM

## 2013-07-16 DIAGNOSIS — F319 Bipolar disorder, unspecified: Secondary | ICD-10-CM

## 2013-07-16 DIAGNOSIS — Z9989 Dependence on other enabling machines and devices: Secondary | ICD-10-CM

## 2013-07-16 NOTE — Patient Instructions (Addendum)

## 2013-07-16 NOTE — Progress Notes (Deleted)
Subjective:    Patient ID: Jessica Fitzgerald is a 53 y.o. female.  HPI {Common ambulatory SmartLinks:[id number]}  Review of Systems  Objective:  Neurologic Exam  Physical Exam  Assessment:   ***  Plan:   ***

## 2013-07-16 NOTE — Progress Notes (Signed)
Subjective:    Patient ID: Jessica Fitzgerald is a 53 y.o. female.  HPI    Interim history:   Jessica Fitzgerald is a very pleasant 53 year old right-handed woman with an underlying history of BPAD, insomnia, and OSA, who presents for followup consultation of her obstructive sleep apnea. Jessica Fitzgerald is unaccompanied today. I first met her on 02/17/2013, at which time Jessica Fitzgerald reported that Jessica Fitzgerald was diagnosed with obstructive sleep apnea some 10 years prior but did not want to pursue CPAP therapy at the time. I reviewed her sleep test report from 05/08/2009 which was read by Dr. Halford Chessman: Sleep efficiency was 87% with a latency to sleep of 34 minutes. REM latency was prolonged at 189 minutes. Jessica Fitzgerald had a total AHI of 5 per hour. Loud snoring was noted. Her REM related AHI was 4, her supine AHI was 5.2. Her oxyhemoglobin desaturation nadir was 91%.  At the time of her first visit with me Jessica Fitzgerald reported significant daytime somnolence and had an ESS of 19/24. Jessica Fitzgerald had recently stopped Nuvigil. Jessica Fitzgerald reported occasional morning headaches. Jessica Fitzgerald reported severe snoring and witnessed apneas as well as a weight gain of about 20 pounds since her last sleep test. I suggested that Jessica Fitzgerald return for sleep apnea testing with a sleep study.  Jessica Fitzgerald had a split-night sleep study on 02/28/2013 and I went over her test results with her in detail today. Baseline sleep efficiency was high at 96.6% with a latency to sleep of only 3 minutes and wake after sleep onset of 3 minutes with minimal sleep fragmentation noted. Jessica Fitzgerald had an elevated arousal index of 19.5 per hour. Jessica Fitzgerald had an increased percentage of stage II sleep, very little deep sleep and a mildly reduced percentage of REM sleep. REM sleep latency was mildly increased. Jessica Fitzgerald had mild to moderate snoring. Jessica Fitzgerald slept mostly on her back. Her total AHI was 19.2 per hour. Baseline oxygen saturation was 93%, with a nadir of 82%. Time below 90% saturation was 6 minutes and 47 seconds. Jessica Fitzgerald was then titrated on CPAP.  During that portion of the study Jessica Fitzgerald had a normal REM percentage, an increased percentage of stage II sleep, and absence of deep sleep. Average oxygen saturation was 95%, nadir was 90%. Snoring was eliminated. Jessica Fitzgerald was titrated from 5-6 cm of CPAP with elimination of her sleep disordered breathing. Supine REM sleep was achieved. Jessica Fitzgerald had mild periodic leg movements during the baseline study and severe periodic limb movements during the treatment portion of the study with an index of 50.8 per hour with an associated arousal index of 8.8 per hour. I placed her on CPAP therapy. I reviewed her CPAP compliance data from 05/05/2013 through 06/03/2013 which is a total of 30 days during which time Jessica Fitzgerald uses CPAP every night. Percent used days greater than 4 hours was 90%, indicating excellent compliance. Average usage for all days was 7 hours and 11 minutes. Residual AHI was low at 1.6 per hour. Leak was acceptable. Set pressure is 5 cm with no EPR.  Today, Jessica Fitzgerald did not bring the CPAP machine, but I did receive her recent compliance data via fax: 06/06/2013 through 07/05/2013 which is a total of 30 days, during which time Jessica Fitzgerald uses CPAP every night except for one. Percent used days greater than 4 hours was 97%, indicating excellent compliance. Average usage was 7 hours and 19 minutes. Residual AHI was 1.8 per hour on a pressure of 5 cm with 95th percentile of leak at 22.8. Jessica Fitzgerald reports a great  improvement in her EDS Jessica Fitzgerald feels, her sleep is much improved. Jessica Fitzgerald has been able to reduce her caffeine intake. Jessica Fitzgerald travels with it, too and is very pleased with how Jessica Fitzgerald is doing. Jessica Fitzgerald does take Ambien prn, maybe 1/week, Trazodone every other night or so and Nuvigil prn. Jessica Fitzgerald sprained her L ankle about 2 weeks ago, which is improving.   Her Past Medical History Is Significant For: Past Medical History  Diagnosis Date  . Depression   . Bipolar 1 disorder   . Recurrent binge eating   . Fatigue   . Sleep apnea     ? borderline     Her Past Surgical History Is Significant For: Past Surgical History  Procedure Laterality Date  . Dilation and curettage of uterus    . Tubal ligation    . Abdominal hysterectomy    . Fracture surgery  2004    lt tibial,lt forearm,skull  . Ovarian cyst surgery  2005  . Wound debridement  2004    vac placement-faciotomy-lt forearm  . Tummy tuck  2012    Her Family History Is Significant For: Family History  Problem Relation Age of Onset  . Depression Mother      ? Bipolar   . Stroke Father 60  . Atrial fibrillation Sister   . Depression Maternal Grandmother     Bipolar disorder  . Cancer Neg Hx   . Diabetes Neg Hx   . Depression Father   . Parkinson's disease Father     Her Social History Is Significant For: History   Social History  . Marital Status: Single    Spouse Name: N/A    Number of Children: 1  . Years of Education: post grad   Occupational History  .      self employed   Social History Main Topics  . Smoking status: Never Smoker   . Smokeless tobacco: Never Used     Comment: used to chew nicotine gum  . Alcohol Use: No     Comment:  Quit 2003 & 2006  . Drug Use: No  . Sexual Activity: Yes   Other Topics Concern  . None   Social History Narrative  . None    Her Allergies Are:  Allergies  Allergen Reactions  . Sulfonamide Derivatives     REACTION: Respitory Problems and Rash  :   Her Current Medications Are:  Outpatient Encounter Prescriptions as of 07/16/2013  Medication Sig  . ARIPiprazole (ABILIFY) 10 MG tablet Take 10 mg by mouth every evening.  . desvenlafaxine (PRISTIQ) 50 MG 24 hr tablet Take 50 mg by mouth every evening.  . lamoTRIgine (LAMICTAL) 200 MG tablet Take 200 mg by mouth daily.  . traZODone (DESYREL) 150 MG tablet Take 150 mg by mouth at bedtime.  Marland Kitchen zolpidem (AMBIEN) 10 MG tablet Take 10 mg by mouth at bedtime as needed for sleep.  . [DISCONTINUED] buPROPion (WELLBUTRIN XL) 150 MG 24 hr tablet Take 150 mg by mouth  daily.  :  Review of Systems:  Out of a complete 14 point review of systems, all are reviewed and negative with the exception of these symptoms as listed below: Review of Systems  All other systems reviewed and are negative.   Objective:  Neurologic Exam  Physical Exam Physical Examination:   Filed Vitals:   07/16/13 1211  BP: 135/85  Pulse: 69  Temp: 98.1 F (36.7 C)   General Examination: The patient is a very pleasant 53 y.o. female in  no acute distress. Jessica Fitzgerald appears well-developed and well-nourished and well groomed.   HEENT: Normocephalic, atraumatic, pupils are equal, round and reactive to light and accommodation. Funduscopic exam is normal with sharp disc margins noted. Extraocular tracking is good without limitation to gaze excursion or nystagmus noted. Normal smooth pursuit is noted. Hearing is grossly intact. Tympanic membranes are clear bilaterally. Face is symmetric with normal facial animation and normal facial sensation. Speech is clear with no dysarthria noted. There is no hypophonia. There is no lip, neck/head, jaw or voice tremor. Neck is supple with full range of passive and active motion. There are no carotid bruits on auscultation. Oropharynx exam reveals: mild mouth dryness, adequate dental hygiene and mild airway crowding, due to tonsils and narrow airway entry. Mallampati is class I. Tongue protrudes centrally and palate elevates symmetrically. Tonsils are 1+ to 2+. Neck size is 13 7/8.   Chest: Clear to auscultation without wheezing, rhonchi or crackles noted.  Heart: S1+S2+0, regular and normal without murmurs, rubs or gallops noted.   Abdomen: Soft, non-tender and non-distended with normal bowel sounds appreciated on auscultation.  Extremities: There is no pitting edema in the distal lower extremities bilaterally. Pedal pulses are intact.  Skin: Warm and dry without trophic changes noted. There are no varicose veins.  Musculoskeletal: exam reveals no  obvious joint deformities, tenderness or joint swelling or erythema.   Neurologically:  Mental status: The patient is awake, alert and oriented in all 4 spheres. Her memory, attention, language and knowledge are appropriate. There is no aphasia, agnosia, apraxia or anomia. Speech is clear with normal prosody and enunciation. Thought process is linear. Mood is congruent and affect is normal.  Cranial nerves are as described above under HEENT exam. In addition, shoulder shrug is normal with equal shoulder height noted. Motor exam: Normal bulk, strength and tone is noted. There is no drift, tremor or rebound. Romberg is negative. Reflexes are 2+ throughout. Toes are downgoing bilaterally. Fine motor skills are intact with normal finger taps, normal hand movements, normal rapid alternating patting, normal foot taps and normal foot agility.  Cerebellar testing shows no dysmetria or intention tremor on finger to nose testing. Heel to shin is unremarkable bilaterally. There is no truncal or gait ataxia.  Sensory exam is intact to light touch, pinprick, vibration, temperature sense in the upper and lower extremities.  Gait, station and balance are unremarkable. No veering to one side is noted. No leaning to one side is noted. Posture is age-appropriate and stance is narrow based. No problems turning are noted. Jessica Fitzgerald turns en bloc. Tandem walk is unremarkable.      Assessment and Plan:    In summary, SIYAH MAULT is a very pleasant 53 y.o.-year old female with an underlying history of BPAD, insomnia, and OSA, who has recently had confirmation of obstructive sleep apnea during a split-night sleep study. Jessica Fitzgerald is now established on CPAP treatment and indicates great results with improvement of her daytime somnolence and sleep consolidation. Jessica Fitzgerald feels Jessica Fitzgerald is sleeping much better and is very pleased with how Jessica Fitzgerald's doing. Her physical exam is stable. Today we discussed her sleep test results as well as her recent  compliance data. I congratulated her on being so compliant with treatment and encouraged her to continue using CPAP regularly to reduce her cardiovascular risks. We also talked about trying to maintaining a healthy lifestyle in general. I encouraged the patient to eat healthy, exercise daily and keep well hydrated, to keep a scheduled bedtime and  wake time routine, to not skip any meals and eat healthy snacks in between meals and to have protein with every meal. I stressed the importance of regular exercise.   I answered all her questions today and the patient was in agreement with the above outlined plan. I would like to see the patient back in 6 months, sooner if the need arises and encouraged her to call with any interim questions, concerns, problems or updates. Hopefully, if Jessica Fitzgerald continues to do well, I will be able to see her on a yearly basis routinely.

## 2013-09-02 NOTE — Progress Notes (Signed)
Subjective:    Patient ID: Jessica Fitzgerald is a 53 y.o. female.  HPIReview of Systems  Objective:  Neurologic Exam  Physical Exam  Pt left without being seen

## 2013-10-05 ENCOUNTER — Encounter: Payer: Self-pay | Admitting: Family Medicine

## 2013-10-05 ENCOUNTER — Ambulatory Visit (INDEPENDENT_AMBULATORY_CARE_PROVIDER_SITE_OTHER): Payer: BC Managed Care – PPO | Admitting: Family Medicine

## 2013-10-05 VITALS — Ht 65.5 in | Wt 164.5 lb

## 2013-10-05 DIAGNOSIS — F5000 Anorexia nervosa, unspecified: Secondary | ICD-10-CM

## 2013-10-05 DIAGNOSIS — F5081 Binge eating disorder: Secondary | ICD-10-CM

## 2013-10-05 NOTE — Patient Instructions (Addendum)
-   Continue to eat at least 3 meals and 1 snack per day.  Aim for no more than 5 hours between eating and eat breakfast within the first hour of getting up.    - Follow food plan provided today, and record intake on the form provided:  5  STARCH, 4  VEGETABLES, 9  PROTEIN, 6  FRUITS, 3  MILK/YOGURT, 6  FAT.  - Recommended books:  Any book by Lavone Nian (except her book, "Lost and Found").    - Bring in your Abelino Derrick book to discuss.   - At follow-up, we'll review dealing with eating for non-hunger reasons.

## 2013-10-05 NOTE — Progress Notes (Signed)
Medical Nutrition Therapy:  Appt start time: 1130 end time:  1230.  Assessment:  Primary concerns today: eating disorder.  Jessica Fitzgerald has recently completed 6 wks treatment for binge eating D/O at [facility] in [place], [place] (near [place]).  She had treatement for alcoholism in 2001 and 2003, and was finally successful with this in [place] in [place], [place]; has been sober for 9 yrs now.  She would like help with meal planning and weight loss (15 lb).  She sees therapist Lorenda Cahill and Emerson Monte for [facility] management.  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    Usual eating pattern includes 3 meals and 1 afternoon snack per day. Usual physical activity includes running 45-50 min daily.   Jessica Fitzgerald would like to weigh 135-140 lb; was 172 lb at her highest.   Jessica Fitzgerald does not have a specific meal plan.  Frequent foods include unsweetened iced tea, bananas, apples, chicken, Fage vanilla Greek yogurt, fruit.  Avoided foods include pizza, cake, cookies, sweets (binge foods).  Peanut butter is also a trigger food.    24-hr recall: (Up at 4:30 AM) Run 50 min  B (7:30 AM)-   Raw Fit protein shake; 80-100 oz of diluted iced tea all day, 4 espressos all morning Snk (10 AM)-   6 oz [place] yogurt, honey, 1 c mixed frt:  blueberries, banana, mandarin oranges L (1 PM)-  1 pb & j sandwich (2 tbsp pb), 4 Nature's Own Brown Sugar Maple Oats and Honey Snk (2 PM)-  6 oz [place] yogurt, honey, 1 c mixed frt:  blueberries, banana, mandarin oranges D (5 PM)-  3 pcs bbq chx pizza Snk ( PM)-  3 medium oranges  Progress Towards Goal(s):  In progress.   Nutritional Diagnosis:  NB-1.2 Harmful beliefs/attitudes about food or nutrition-related topics (use with caution) As related to binge eating disorder.  As evidenced by patient concern re trigger foods and binge tendencies.    Intervention:  Nutrition education.  Monitoring/Evaluation:  Dietary intake, exercise, and body weight in 4  week(s).

## 2013-11-02 ENCOUNTER — Ambulatory Visit: Payer: BC Managed Care – PPO | Admitting: Family Medicine

## 2014-02-01 ENCOUNTER — Telehealth: Payer: Self-pay

## 2014-02-01 NOTE — Telephone Encounter (Signed)
Message copied by Sebastian AcheBROCK, Ayyub Krall E on Tue Feb 01, 2014 10:31 AM ------      Message from: Huston FoleyATHAR, SAIMA      Created: Wed Jan 26, 2014  1:32 PM      Regarding: RE: CPAP       Patient can be advised that using humidifier is primarily for comfort. Since she is on a low pressure with her CPAP she may not feel the need for additional humidification. It is okay to use CPAP without humidity if she is not suffering from significant mouth dryness. In the dry season coming up, however, she may want to add the humidification and start using a humidifier, but it is up to her liking.      sa            ----- Message -----         From: Sebastian AcheLatricia E Fina Heizer         Sent: 01/26/2014   1:23 PM           To: Huston FoleySaima Athar, MD      Subject: CPAP                                                     Patient called and would like to know if she has to use the humidifier with her machine she states that she hasn't used it in four months, but would like to make sure this is ok. If you could just let me know and I will contact the patient.             Thanks,      Nicki Guadalajararicia       ------

## 2015-12-24 ENCOUNTER — Emergency Department (HOSPITAL_COMMUNITY)
Admission: EM | Admit: 2015-12-24 | Discharge: 2015-12-24 | Disposition: A | Discharge status: Home / Self Care | Payer: BLUE CROSS/BLUE SHIELD | Attending: Emergency Medicine | Admitting: Emergency Medicine

## 2015-12-24 ENCOUNTER — Encounter (HOSPITAL_COMMUNITY): Payer: Self-pay | Admitting: Emergency Medicine

## 2015-12-24 DIAGNOSIS — F319 Bipolar disorder, unspecified: Secondary | ICD-10-CM | POA: Insufficient documentation

## 2015-12-24 DIAGNOSIS — Z79899 Other long term (current) drug therapy: Secondary | ICD-10-CM | POA: Insufficient documentation

## 2015-12-24 DIAGNOSIS — F410 Panic disorder [episodic paroxysmal anxiety] without agoraphobia: Secondary | ICD-10-CM

## 2015-12-24 NOTE — ED Provider Notes (Signed)
[facility]-EMERGENCY DEPT Provider Note   CSN: [id number] Arrival date & time: 12/24/15  1027     History   Chief Complaint Chief Complaint  Patient presents with  . medication evaluation    HPI Ronald LoboKimberly A Frazee is a 55 y.o. female.  Ronald LoboKimberly A Heinzman is a 55 y.o. female with history of bipolar 1 disorder and fibromyalgia presents to ED with complaint of needing her medications adjusted. Patient reports she has bipolar 1 disorder and that she "waited too long" to see her provider Dr. Nolen MuMcKinney and thinks she needs adjustments to her medications. She has had increase in stress and anxiety over the last two weeks due to "situational stressors." She reports stress culminated this morning and describes having a "panic attack," prompting her to come to the ED. She did not take her home medications this morning. She is currently asymptomatic and reports "feeling fine." She states she is "upset," but denies depression, anhedonia, SI/HI, V/A hallucinations, decrease in concentration, changes in appetite, paranoia, or sleep disturbances. She denies fever, chest pain, shortness of breath, abdominal pain, or headaches. Patient reports she is 11 years sober. No recreational drug use. She does not smoke, but does chew nicorette. She is scheduled to see Dr. Nolen MuMcKinney tomorrow at 3:30pm for medication adjustment. She does not feel she needs to speak with TTS.       Past Medical History:  Diagnosis Date  . Bipolar 1 disorder (HCC)   . Depression   . Fatigue   . Recurrent binge eating   . Sleep apnea    ? borderline    Patient Active Problem List   Diagnosis Date Noted  . BIPOLAR DISORDER UNSPECIFIED 02/06/2010  . FATIGUE 11/30/2008  . WEIGHT GAIN 11/30/2008    Past Surgical History:  Procedure Laterality Date  . ABDOMINAL HYSTERECTOMY    . DILATION AND CURETTAGE OF UTERUS    . FRACTURE SURGERY  2004   lt tibial,lt forearm,skull  . OVARIAN CYST SURGERY  2005  . TUBAL LIGATION    . tummy tuck   2012  . WOUND DEBRIDEMENT  2004   vac placement-faciotomy-lt forearm    OB History    No data available       Home Medications    Prior to Admission medications   Medication Sig Start Date End Date Taking? Authorizing Provider  ARIPiprazole (ABILIFY) 10 MG tablet Take 10 mg by mouth every evening.    Historical Provider, MD  Armodafinil (NUVIGIL) 200 MG TABS Take by mouth as needed.    Historical Provider, MD  desvenlafaxine (PRISTIQ) 50 MG 24 hr tablet Take 100 mg by mouth every evening.     Historical Provider, MD  doxepin (SINEQUAN) 100 MG capsule Take 100 mg by mouth at bedtime.    Historical Provider, MD  lamoTRIgine (LAMICTAL) 200 MG tablet Take 200 mg by mouth daily.    Historical Provider, MD  traZODone (DESYREL) 150 MG tablet Take 300 mg by mouth at bedtime.     Historical Provider, MD  zolpidem (AMBIEN) 10 MG tablet Take 10 mg by mouth at bedtime as needed for sleep.    Historical Provider, MD    Family History Family History  Problem Relation Age of Onset  . Depression Mother      ? Bipolar   . Stroke Father [id number]  . Depression Father   . Parkinson's disease Father   . Atrial fibrillation Sister   . Depression Maternal Grandmother     Bipolar disorder  .  Cancer Neg Hx   . Diabetes Neg Hx     Social History Social History  Substance Use Topics  . Smoking status: Never Smoker  . Smokeless tobacco: Never Used     Comment: used to chew nicotine gum  . Alcohol use No     Comment:  Quit 2003 & 2006     Allergies   Sulfonamide derivatives   Review of Systems Review of Systems  Constitutional: Negative for fever.  HENT: Negative for congestion.   Eyes: Negative for visual disturbance.  Respiratory: Negative for shortness of breath.   Cardiovascular: Negative for chest pain.  Gastrointestinal: Negative for abdominal pain.  Genitourinary: Negative for dysuria and hematuria.  Musculoskeletal: Negative for arthralgias and myalgias.  Skin: Negative for rash.   Neurological: Negative for headaches.  Psychiatric/Behavioral: Negative for decreased concentration, sleep disturbance and suicidal ideas. The patient is nervous/anxious.      Physical Exam Updated Vital Signs BP 167/93 (BP Location: Left Arm)   Pulse 90   Temp 98.3 F (36.8 C) (Oral)   Resp 16   Ht 5' 5.5" (1.664 m)   Wt 64 kg   SpO2 100%   BMI 23.11 kg/m   Physical Exam  Constitutional: She appears well-developed and well-nourished. No distress.  HENT:  Head: Normocephalic and atraumatic.  Mouth/Throat: Oropharynx is clear and moist. No oropharyngeal exudate.  Eyes: Conjunctivae and EOM are normal. Pupils are equal, round, and reactive to light. Right eye exhibits no discharge. Left eye exhibits no discharge. No scleral icterus.  Neck: Normal range of motion. Neck supple.  Cardiovascular: Normal rate, regular rhythm, normal heart sounds and intact distal pulses.   No murmur heard. Pulmonary/Chest: Effort normal. No respiratory distress. She has no wheezes. She has no rales.  Abdominal: Soft. Bowel sounds are normal. She exhibits no distension. There is no tenderness. There is no rebound and no guarding.  Musculoskeletal: Normal range of motion.  Lymphadenopathy:    She has no cervical adenopathy.  Neurological: She is alert. Coordination normal.  Skin: Skin is warm and dry. She is not diaphoretic.  Psychiatric: She has a normal mood and affect. Her behavior is normal.     ED Treatments / Results  Labs (all labs ordered are listed, but only abnormal results are displayed) Labs Reviewed - No data to display  EKG  EKG Interpretation None       Radiology No results found.  Procedures Procedures (including critical care time)  Medications Ordered in ED Medications - No data to display   Initial Impression / Assessment and Plan / ED Course  I have reviewed the triage vital signs and the nursing notes.  Pertinent labs & imaging results that were available  during my care of the patient were reviewed by me and considered in my medical decision making (see chart for details).  Clinical Course    Patient is afebrile and non-toxic appearing in NAD. She is sitting comfortably in the chair, talking on her phone. Vital signs are remarkable for elevated BP, otherwise stable. Patient is currently asx. Physical exam unremarkable. She declines TTS consult. No SI/HI; no V/A hallucinations. Do not feel patient is a danger to self or others. Encouraged patient to take home medications as prescribed. Follow up with Dr. Nolen MuMcKinney as scheduled tomorrow. Return precautions discussed. Patient voiced understanding and is agreeable. Patient is safe for discharge.    Final Clinical Impressions(s) / ED Diagnoses   Final diagnoses:  Panic attack  Bipolar 1 disorder (HCC)  New Prescriptions New Prescriptions   No medications on file     Lona Kettle, PA-C 12/24/15 1134    Charlynne Pander, MD 12/24/15 [phone]

## 2015-12-24 NOTE — Discharge Instructions (Signed)
Read the information below.   Take your home medications as prescribed today.  Be sure to keep your scheduled appointment with Dr. Nolen MuMcKinney tomorrow for adjustment of medications.  You may return to the Emergency Department at any time for worsening condition or any new symptoms that concern you. Return to ED if you develop any new or worsening symptoms, suicidal or homicidal thoughts, hallucinations, chest pain, shortness of breath, or loss of consciousness.

## 2015-12-24 NOTE — ED Triage Notes (Signed)
Patient states that she is paranoid and feels her medications need adjusting.  Patient having symptoms for past 2 weeks. States that she got an appointment for tomorrow afternoon with her Dr Emerson MonteParrish McKinney.  Patient denies SI/HI, just wants medications adjusted.

## 2016-03-19 DIAGNOSIS — F3112 Bipolar disorder, current episode manic without psychotic features, moderate: Secondary | ICD-10-CM | POA: Diagnosis not present

## 2016-04-22 DIAGNOSIS — F3112 Bipolar disorder, current episode manic without psychotic features, moderate: Secondary | ICD-10-CM | POA: Diagnosis not present

## 2016-05-15 DIAGNOSIS — F3112 Bipolar disorder, current episode manic without psychotic features, moderate: Secondary | ICD-10-CM | POA: Diagnosis not present

## 2016-05-17 DIAGNOSIS — Z1231 Encounter for screening mammogram for malignant neoplasm of breast: Secondary | ICD-10-CM | POA: Diagnosis not present

## 2016-05-21 DIAGNOSIS — N6002 Solitary cyst of left breast: Secondary | ICD-10-CM | POA: Diagnosis not present

## 2016-05-23 DIAGNOSIS — F3342 Major depressive disorder, recurrent, in full remission: Secondary | ICD-10-CM | POA: Diagnosis not present

## 2016-05-23 DIAGNOSIS — F411 Generalized anxiety disorder: Secondary | ICD-10-CM | POA: Diagnosis not present

## 2016-06-11 DIAGNOSIS — Z1211 Encounter for screening for malignant neoplasm of colon: Secondary | ICD-10-CM | POA: Diagnosis not present

## 2016-06-11 DIAGNOSIS — K640 First degree hemorrhoids: Secondary | ICD-10-CM | POA: Diagnosis not present

## 2016-07-02 DIAGNOSIS — F3112 Bipolar disorder, current episode manic without psychotic features, moderate: Secondary | ICD-10-CM | POA: Diagnosis not present

## 2016-08-23 DIAGNOSIS — F3342 Major depressive disorder, recurrent, in full remission: Secondary | ICD-10-CM | POA: Diagnosis not present

## 2016-09-11 DIAGNOSIS — F3112 Bipolar disorder, current episode manic without psychotic features, moderate: Secondary | ICD-10-CM | POA: Diagnosis not present

## 2016-10-17 DIAGNOSIS — M7981 Nontraumatic hematoma of soft tissue: Secondary | ICD-10-CM | POA: Diagnosis not present

## 2016-10-17 DIAGNOSIS — L570 Actinic keratosis: Secondary | ICD-10-CM | POA: Diagnosis not present

## 2016-10-17 DIAGNOSIS — C44619 Basal cell carcinoma of skin of left upper limb, including shoulder: Secondary | ICD-10-CM | POA: Diagnosis not present

## 2016-10-17 DIAGNOSIS — L821 Other seborrheic keratosis: Secondary | ICD-10-CM | POA: Diagnosis not present

## 2016-10-23 DIAGNOSIS — F3112 Bipolar disorder, current episode manic without psychotic features, moderate: Secondary | ICD-10-CM | POA: Diagnosis not present

## 2016-11-26 DIAGNOSIS — N6002 Solitary cyst of left breast: Secondary | ICD-10-CM | POA: Diagnosis not present

## 2016-11-28 DIAGNOSIS — C44619 Basal cell carcinoma of skin of left upper limb, including shoulder: Secondary | ICD-10-CM | POA: Diagnosis not present

## 2017-02-14 DIAGNOSIS — F410 Panic disorder [episodic paroxysmal anxiety] without agoraphobia: Secondary | ICD-10-CM | POA: Diagnosis not present

## 2017-02-14 DIAGNOSIS — F3181 Bipolar II disorder: Secondary | ICD-10-CM | POA: Diagnosis not present

## 2017-02-14 DIAGNOSIS — F3174 Bipolar disorder, in full remission, most recent episode manic: Secondary | ICD-10-CM | POA: Diagnosis not present

## 2017-02-25 DIAGNOSIS — H6123 Impacted cerumen, bilateral: Secondary | ICD-10-CM | POA: Diagnosis not present

## 2017-02-25 DIAGNOSIS — Z23 Encounter for immunization: Secondary | ICD-10-CM | POA: Diagnosis not present

## 2017-03-17 DIAGNOSIS — M549 Dorsalgia, unspecified: Secondary | ICD-10-CM | POA: Diagnosis not present

## 2017-03-17 DIAGNOSIS — M545 Low back pain: Secondary | ICD-10-CM | POA: Diagnosis not present

## 2017-04-08 DIAGNOSIS — F3112 Bipolar disorder, current episode manic without psychotic features, moderate: Secondary | ICD-10-CM | POA: Diagnosis not present

## 2017-04-23 DIAGNOSIS — F3112 Bipolar disorder, current episode manic without psychotic features, moderate: Secondary | ICD-10-CM | POA: Diagnosis not present

## 2017-04-30 DIAGNOSIS — F3112 Bipolar disorder, current episode manic without psychotic features, moderate: Secondary | ICD-10-CM | POA: Diagnosis not present

## 2017-05-02 DIAGNOSIS — F410 Panic disorder [episodic paroxysmal anxiety] without agoraphobia: Secondary | ICD-10-CM | POA: Diagnosis not present

## 2017-05-02 DIAGNOSIS — F3131 Bipolar disorder, current episode depressed, mild: Secondary | ICD-10-CM | POA: Diagnosis not present

## 2017-05-02 DIAGNOSIS — F4321 Adjustment disorder with depressed mood: Secondary | ICD-10-CM | POA: Diagnosis not present

## 2017-05-08 DIAGNOSIS — F3112 Bipolar disorder, current episode manic without psychotic features, moderate: Secondary | ICD-10-CM | POA: Diagnosis not present

## 2017-05-14 DIAGNOSIS — F3112 Bipolar disorder, current episode manic without psychotic features, moderate: Secondary | ICD-10-CM | POA: Diagnosis not present

## 2017-05-19 DIAGNOSIS — F3131 Bipolar disorder, current episode depressed, mild: Secondary | ICD-10-CM | POA: Diagnosis not present

## 2017-05-19 DIAGNOSIS — F3111 Bipolar disorder, current episode manic without psychotic features, mild: Secondary | ICD-10-CM | POA: Diagnosis not present

## 2017-05-21 DIAGNOSIS — F3112 Bipolar disorder, current episode manic without psychotic features, moderate: Secondary | ICD-10-CM | POA: Diagnosis not present

## 2017-05-28 DIAGNOSIS — F3112 Bipolar disorder, current episode manic without psychotic features, moderate: Secondary | ICD-10-CM | POA: Diagnosis not present

## 2017-06-04 DIAGNOSIS — F3112 Bipolar disorder, current episode manic without psychotic features, moderate: Secondary | ICD-10-CM | POA: Diagnosis not present

## 2017-06-17 DIAGNOSIS — F3112 Bipolar disorder, current episode manic without psychotic features, moderate: Secondary | ICD-10-CM | POA: Diagnosis not present

## 2017-06-24 DIAGNOSIS — F3112 Bipolar disorder, current episode manic without psychotic features, moderate: Secondary | ICD-10-CM | POA: Diagnosis not present

## 2017-06-29 DIAGNOSIS — S838X1A Sprain of other specified parts of right knee, initial encounter: Secondary | ICD-10-CM | POA: Diagnosis not present

## 2017-06-30 DIAGNOSIS — F3112 Bipolar disorder, current episode manic without psychotic features, moderate: Secondary | ICD-10-CM | POA: Diagnosis not present

## 2017-07-02 DIAGNOSIS — M25561 Pain in right knee: Secondary | ICD-10-CM | POA: Diagnosis not present

## 2017-07-03 DIAGNOSIS — S83511A Sprain of anterior cruciate ligament of right knee, initial encounter: Secondary | ICD-10-CM | POA: Diagnosis not present

## 2017-07-14 DIAGNOSIS — S83511D Sprain of anterior cruciate ligament of right knee, subsequent encounter: Secondary | ICD-10-CM | POA: Diagnosis not present

## 2017-07-16 DIAGNOSIS — F3112 Bipolar disorder, current episode manic without psychotic features, moderate: Secondary | ICD-10-CM | POA: Diagnosis not present

## 2017-07-23 DIAGNOSIS — M25561 Pain in right knee: Secondary | ICD-10-CM | POA: Diagnosis not present

## 2017-07-23 DIAGNOSIS — S83511D Sprain of anterior cruciate ligament of right knee, subsequent encounter: Secondary | ICD-10-CM | POA: Diagnosis not present

## 2017-07-23 DIAGNOSIS — M6281 Muscle weakness (generalized): Secondary | ICD-10-CM | POA: Diagnosis not present

## 2017-07-23 DIAGNOSIS — M25661 Stiffness of right knee, not elsewhere classified: Secondary | ICD-10-CM | POA: Diagnosis not present

## 2017-07-29 DIAGNOSIS — N6324 Unspecified lump in the left breast, lower inner quadrant: Secondary | ICD-10-CM | POA: Diagnosis not present

## 2017-07-30 DIAGNOSIS — F3112 Bipolar disorder, current episode manic without psychotic features, moderate: Secondary | ICD-10-CM | POA: Diagnosis not present

## 2017-08-01 DIAGNOSIS — M25561 Pain in right knee: Secondary | ICD-10-CM | POA: Diagnosis not present

## 2017-08-01 DIAGNOSIS — S83511D Sprain of anterior cruciate ligament of right knee, subsequent encounter: Secondary | ICD-10-CM | POA: Diagnosis not present

## 2017-08-01 DIAGNOSIS — M6281 Muscle weakness (generalized): Secondary | ICD-10-CM | POA: Diagnosis not present

## 2017-08-01 DIAGNOSIS — M25661 Stiffness of right knee, not elsewhere classified: Secondary | ICD-10-CM | POA: Diagnosis not present

## 2017-08-04 DIAGNOSIS — S83511D Sprain of anterior cruciate ligament of right knee, subsequent encounter: Secondary | ICD-10-CM | POA: Diagnosis not present

## 2017-08-06 DIAGNOSIS — M25561 Pain in right knee: Secondary | ICD-10-CM | POA: Diagnosis not present

## 2017-08-06 DIAGNOSIS — M6281 Muscle weakness (generalized): Secondary | ICD-10-CM | POA: Diagnosis not present

## 2017-08-06 DIAGNOSIS — M25661 Stiffness of right knee, not elsewhere classified: Secondary | ICD-10-CM | POA: Diagnosis not present

## 2017-08-06 DIAGNOSIS — S83511D Sprain of anterior cruciate ligament of right knee, subsequent encounter: Secondary | ICD-10-CM | POA: Diagnosis not present

## 2017-08-08 DIAGNOSIS — F3181 Bipolar II disorder: Secondary | ICD-10-CM | POA: Diagnosis not present

## 2017-08-08 DIAGNOSIS — F410 Panic disorder [episodic paroxysmal anxiety] without agoraphobia: Secondary | ICD-10-CM | POA: Diagnosis not present

## 2017-08-13 DIAGNOSIS — F3112 Bipolar disorder, current episode manic without psychotic features, moderate: Secondary | ICD-10-CM | POA: Diagnosis not present

## 2017-08-13 DIAGNOSIS — S83511D Sprain of anterior cruciate ligament of right knee, subsequent encounter: Secondary | ICD-10-CM | POA: Diagnosis not present

## 2017-08-13 DIAGNOSIS — M6281 Muscle weakness (generalized): Secondary | ICD-10-CM | POA: Diagnosis not present

## 2017-08-13 DIAGNOSIS — M25561 Pain in right knee: Secondary | ICD-10-CM | POA: Diagnosis not present

## 2017-08-13 DIAGNOSIS — M25661 Stiffness of right knee, not elsewhere classified: Secondary | ICD-10-CM | POA: Diagnosis not present

## 2017-08-18 DIAGNOSIS — M25661 Stiffness of right knee, not elsewhere classified: Secondary | ICD-10-CM | POA: Diagnosis not present

## 2017-08-18 DIAGNOSIS — M25561 Pain in right knee: Secondary | ICD-10-CM | POA: Diagnosis not present

## 2017-08-18 DIAGNOSIS — S83511D Sprain of anterior cruciate ligament of right knee, subsequent encounter: Secondary | ICD-10-CM | POA: Diagnosis not present

## 2017-08-18 DIAGNOSIS — M6281 Muscle weakness (generalized): Secondary | ICD-10-CM | POA: Diagnosis not present

## 2017-08-20 DIAGNOSIS — F3112 Bipolar disorder, current episode manic without psychotic features, moderate: Secondary | ICD-10-CM | POA: Diagnosis not present

## 2017-09-09 DIAGNOSIS — F3112 Bipolar disorder, current episode manic without psychotic features, moderate: Secondary | ICD-10-CM | POA: Diagnosis not present

## 2017-09-23 DIAGNOSIS — F3112 Bipolar disorder, current episode manic without psychotic features, moderate: Secondary | ICD-10-CM | POA: Diagnosis not present

## 2017-10-08 DIAGNOSIS — F3112 Bipolar disorder, current episode manic without psychotic features, moderate: Secondary | ICD-10-CM | POA: Diagnosis not present

## 2017-10-28 DIAGNOSIS — F3112 Bipolar disorder, current episode manic without psychotic features, moderate: Secondary | ICD-10-CM | POA: Diagnosis not present

## 2017-11-02 ENCOUNTER — Emergency Department (HOSPITAL_COMMUNITY)
Admission: EM | Admit: 2017-11-02 | Discharge: 2017-11-02 | Disposition: A | Discharge status: Home / Self Care | Payer: BLUE CROSS/BLUE SHIELD | Attending: Emergency Medicine | Admitting: Emergency Medicine

## 2017-11-02 ENCOUNTER — Encounter (HOSPITAL_COMMUNITY): Payer: Self-pay

## 2017-11-02 ENCOUNTER — Emergency Department (HOSPITAL_COMMUNITY): Payer: BLUE CROSS/BLUE SHIELD

## 2017-11-02 ENCOUNTER — Other Ambulatory Visit: Payer: Self-pay

## 2017-11-02 DIAGNOSIS — Z79899 Other long term (current) drug therapy: Secondary | ICD-10-CM | POA: Insufficient documentation

## 2017-11-02 DIAGNOSIS — M25461 Effusion, right knee: Secondary | ICD-10-CM | POA: Diagnosis not present

## 2017-11-02 DIAGNOSIS — M25561 Pain in right knee: Secondary | ICD-10-CM

## 2017-11-02 DIAGNOSIS — M25469 Effusion, unspecified knee: Secondary | ICD-10-CM

## 2017-11-02 MED ORDER — NAPROXEN 500 MG PO TABS: 500.0000 mg | ORAL_TABLET | Freq: Two times a day (BID) | ORAL | 0 refills | Status: AC

## 2017-11-02 MED ORDER — HYDROCODONE-ACETAMINOPHEN 5-325 MG PO TABS: 1.0000 | ORAL_TABLET | Freq: Four times a day (QID) | ORAL | 0 refills | Status: AC | PRN

## 2017-11-02 MED ORDER — NAPROXEN 500 MG PO TABS
500.0000 mg | ORAL_TABLET | Freq: Once | ORAL | Status: AC
  Administered 2017-11-02: 500 mg via ORAL
  Filled 2017-11-02: qty 1

## 2017-11-02 MED ORDER — OXYCODONE-ACETAMINOPHEN 5-325 MG PO TABS
1.0000 | ORAL_TABLET | Freq: Once | ORAL | Status: AC
  Administered 2017-11-02: 1 via ORAL
  Filled 2017-11-02: qty 1

## 2017-11-02 NOTE — ED Triage Notes (Signed)
States hurt right knee getting into care about 2300 pm last night states now painful 10/10.

## 2017-11-02 NOTE — Discharge Instructions (Signed)
Apply ice to areas of injury 3-4 times per day to limit inflammation.  Use crutches to prevent from putting weight on your right leg.  Keep your right leg elevated as much as possible.  We recommend consistent use of naproxen for pain and inflammation.  You have been prescribed Norco to take as needed for severe pain.  Do not drive or drink alcohol after taking this medication as it may make you drowsy and impair your judgment.  We recommend follow-up with your orthopedist to ensure resolution of symptoms.  Return to the ED for any new or concerning symptoms.

## 2017-11-02 NOTE — ED Provider Notes (Signed)
[facility]-EMERGENCY DEPT Provider Note   CSN: [id number] Arrival date & time: 11/02/17  0244     History   Chief Complaint Chief Complaint  Patient presents with  . Knee Pain    HPI Jessica Fitzgerald is a 57 y.o. female.  57 year old female with a history of bipolar disorder, depression presents to the emergency department for evaluation of right knee pain.  Symptoms began at 2230 this evening while patient was getting out of a car.  She reports feeling instability in the knee.  She was wearing a brace at the time as she has history of ACL injury in February.  This is being followed by Dr. [facility].  She has had constant pain since acute worsening tonight.  She took 1 tablet of Percocet prior to arrival with slight improvement.  Pain is aggravated with weightbearing.  No extremity numbness, paresthesias, weakness.  No falls or direct trauma.  The history is provided by the patient. No language interpreter was used.  Knee Pain      Past Medical History:  Diagnosis Date  . Bipolar 1 disorder (HCC)   . Depression   . Fatigue   . Recurrent binge eating   . Sleep apnea    ? borderline    Patient Active Problem List   Diagnosis Date Noted  . BIPOLAR DISORDER UNSPECIFIED 02/06/2010  . FATIGUE 11/30/2008  . WEIGHT GAIN 11/30/2008    Past Surgical History:  Procedure Laterality Date  . ABDOMINAL HYSTERECTOMY    . DILATION AND CURETTAGE OF UTERUS    . FRACTURE SURGERY  2004   lt tibial,lt forearm,skull  . OVARIAN CYST SURGERY  2005  . TUBAL LIGATION    . tummy tuck  2012  . WOUND DEBRIDEMENT  2004   vac placement-faciotomy-lt forearm     OB History   None      Home Medications    Prior to Admission medications   Medication Sig Start Date End Date Taking? Authorizing Provider  ARIPiprazole (ABILIFY) 10 MG tablet Take 10 mg by mouth every evening.    [provider]  Armodafinil (NUVIGIL) 200 MG TABS Take by mouth as  needed.    [provider]  desvenlafaxine (PRISTIQ) 50 MG 24 hr tablet Take 100 mg by mouth every evening.     [provider]  doxepin (SINEQUAN) 100 MG capsule Take 100 mg by mouth at bedtime.    [provider]  HYDROcodone-acetaminophen (NORCO/VICODIN) 5-325 MG tablet Take 1-2 tablets by mouth every 6 (six) hours as needed for severe pain. 11/02/17   Antony MaduraHumes, Allisen Pidgeon, PA-C  lamoTRIgine (LAMICTAL) 200 MG tablet Take 200 mg by mouth daily.    [provider]  naproxen (NAPROSYN) 500 MG tablet Take 1 tablet (500 mg total) by mouth 2 (two) times daily. 11/02/17   Antony MaduraHumes, Oisin Yoakum, PA-C  traZODone (DESYREL) 150 MG tablet Take 300 mg by mouth at bedtime.     [provider]  zolpidem (AMBIEN) 10 MG tablet Take 10 mg by mouth at bedtime as needed for sleep.    [provider]    Family History Family History  Problem Relation Age of Onset  . Depression Mother         ? Bipolar   . Stroke Father 2870  . Depression Father   . Parkinson's disease Father   . Atrial fibrillation Sister   . Depression Maternal Grandmother        Bipolar disorder  .  Cancer Neg Hx   . Diabetes Neg Hx     Social History Social History   Tobacco Use  . Smoking status: Never Smoker  . Smokeless tobacco: Never Used  . Tobacco comment: used to chew nicotine gum  Substance Use Topics  . Alcohol use: No    Comment:  Quit 2003 & 2006  . Drug use: No     Allergies   Sulfonamide derivatives   Review of Systems Review of Systems Ten systems reviewed and are negative for acute change, except as noted in the HPI.    Physical Exam Updated Vital Signs BP 119/76   Pulse 86   Temp 98.9 F (37.2 C) (Oral)   Resp 16   Ht 5\' 6"  (1.676 m)   Wt 64 kg (141 lb)   SpO2 97%   BMI 22.76 kg/m   Physical Exam  Constitutional: She is oriented to person, place, and time. She appears well-developed and well-nourished. No distress.  Nontoxic appearing and in NAD  HENT:    Head: Normocephalic and atraumatic.  Eyes: Conjunctivae and EOM are normal. No scleral icterus.  Neck: Normal range of motion.  Cardiovascular: Normal rate, regular rhythm and intact distal pulses.  DP pulse 2+ in the right lower extremity  Pulmonary/Chest: Effort normal. No respiratory distress.  Respirations even and unlabored  Musculoskeletal: Normal range of motion.  Full passive range of motion of the right knee.  Slight limited active range of motion secondary to pain.  Normal patellar mobility.  No bony deformity or crepitus.  Tenderness palpation noted to the medial aspect of the right knee.  No palpable effusion.  Mild swelling.  Neurological: She is alert and oriented to person, place, and time. She exhibits normal muscle tone. Coordination normal.  Sensation to light touch intact in bilateral lower extremities.  Skin: Skin is warm and dry. No rash noted. She is not diaphoretic. No erythema. No pallor.  Psychiatric: She has a normal mood and affect. Her behavior is normal.  Nursing note and vitals reviewed.    ED Treatments / Results  Labs (all labs ordered are listed, but only abnormal results are displayed) Labs Reviewed - No data to display  EKG None  Radiology Dg Knee Complete 4 Views Right  Result Date: 11/02/2017 CLINICAL DATA:  57 year old female with right knee twisting and pain. EXAM: RIGHT KNEE - COMPLETE 4+ VIEW COMPARISON:  None. FINDINGS: There is no acute fracture or dislocation. No significant arthritic changes. Small suprapatellar effusion. The soft tissues appear unremarkable. IMPRESSION: No acute fracture or dislocation. Small suprapatellar effusion. Electronically Signed   By: Elgie Collard M.D.   On: 11/02/2017 03:41    Procedures Procedures (including critical care time)  Medications Ordered in ED Medications  naproxen (NAPROSYN) tablet 500 mg (500 mg Oral Given 11/02/17 0338)  oxyCODONE-acetaminophen (PERCOCET/ROXICET) 5-325 MG per tablet 1  tablet (1 tablet Oral Given 11/02/17 1610)     Initial Impression / Assessment and Plan / ED Course  I have reviewed the triage vital signs and the nursing notes.  Pertinent labs & imaging results that were available during my care of the patient were reviewed by me and considered in my medical decision making (see chart for details).     Patient presents to the emergency department for evaluation of R knee pain. Patient neurovascularly intact on exam. Imaging negative for fracture, dislocation, bony deformity. No swelling, erythema, heat to touch to the affected area; no concern for septic joint. Compartments in the  affected extremity are soft. Plan for supportive management including RICE and NSAIDs; primary care follow up as needed. Return precautions discussed and provided. Patient discharged in stable condition with no unaddressed concerns.   Final Clinical Impressions(s) / ED Diagnoses   Final diagnoses:  Acute pain of right knee  Suprapatellar effusion of knee    ED Discharge Orders        Ordered    naproxen (NAPROSYN) 500 MG tablet  2 times daily     11/02/17 0357    HYDROcodone-acetaminophen (NORCO/VICODIN) 5-325 MG tablet  Every 6 hours PRN     11/02/17 0357       Antony Madura, PA-C 11/02/17 0402    Nira Conn, MD 11/02/17 2020

## 2017-11-03 DIAGNOSIS — S83511A Sprain of anterior cruciate ligament of right knee, initial encounter: Secondary | ICD-10-CM | POA: Diagnosis not present

## 2017-11-03 DIAGNOSIS — S83241A Other tear of medial meniscus, current injury, right knee, initial encounter: Secondary | ICD-10-CM | POA: Diagnosis not present

## 2017-11-05 DIAGNOSIS — M25561 Pain in right knee: Secondary | ICD-10-CM | POA: Diagnosis not present

## 2017-11-07 ENCOUNTER — Encounter (HOSPITAL_BASED_OUTPATIENT_CLINIC_OR_DEPARTMENT_OTHER): Payer: Self-pay | Admitting: Physician Assistant

## 2017-11-07 DIAGNOSIS — S838X1A Sprain of other specified parts of right knee, initial encounter: Secondary | ICD-10-CM | POA: Diagnosis present

## 2017-11-07 DIAGNOSIS — S83511A Sprain of anterior cruciate ligament of right knee, initial encounter: Secondary | ICD-10-CM

## 2017-11-07 HISTORY — DX: Sprain of other specified parts of right knee, initial encounter: S83.8X1A

## 2017-11-07 HISTORY — DX: Sprain of anterior cruciate ligament of right knee, initial encounter: S83.511A

## 2017-11-07 NOTE — H&P (Signed)
Jessica Fitzgerald is an 57 y.o. female.   Chief Complaint: right knee ACL tear and medial meniscus tear HPI: Jessica Fitzgerald is a 57 year old who is seen for a twisting injury to her right knee which occurred two days ago getting into a car. She previously injured her knee and tore her ACL with a partial MCL tear in February 2019, but did well with conservative care. She is now having severe increased pain with  a twisting, pivoting injury to the knee. [facility]17 where X-rays were negative. She has been in a J&J brace and on crutches.   Past Medical History:  Diagnosis Date  . Acute medial meniscal injury of right knee 11/07/2017  . Bipolar 1 disorder (HCC)   . Complete tear of right ACL 11/07/2017  . Depression   . Fatigue   . Recurrent binge eating   . Sleep apnea    ? borderline    Past Surgical History:  Procedure Laterality Date  . ABDOMINAL HYSTERECTOMY    . DILATION AND CURETTAGE OF UTERUS    . FRACTURE SURGERY  2004   lt tibial,lt forearm,skull  . OVARIAN CYST SURGERY  2005  . TUBAL LIGATION    . tummy tuck  2012  . WOUND DEBRIDEMENT  2004   vac placement-faciotomy-lt forearm    Family History  Problem Relation Age of Onset  . Depression Mother         ? Bipolar   . Stroke Father 3870  . Depression Father   . Parkinson's disease Father   . Atrial fibrillation Sister   . Depression Maternal Grandmother        Bipolar disorder  . Cancer Neg Hx   . Diabetes Neg Hx    Social History:  reports that she has never smoked. She has never used smokeless tobacco. She reports that she does not drink alcohol or use drugs.  Allergies:  Allergies  Allergen Reactions  . Sulfonamide Derivatives     REACTION: Respitory Problems and Rash    No medications prior to admission.    No results found for this or any previous visit (from the past 48 hour(s)). No results found.  Review of Systems  Constitutional: Negative.   HENT: Negative.   Eyes:  Negative.   Cardiovascular: Negative.   Gastrointestinal: Negative.   Genitourinary: Negative.   Musculoskeletal: Positive for joint pain.  Skin: Negative.   Neurological: Negative.   Endo/Heme/Allergies: Negative.     There were no vitals taken for this visit. Physical Exam  Constitutional: She is oriented to person, place, and time. She appears well-developed and well-nourished.  HENT:  Head: Normocephalic and atraumatic.  Eyes: Pupils are equal, round, and reactive to light. Conjunctivae are normal.  Neck: Neck supple.  Cardiovascular: Normal rate.  Respiratory: Effort normal.  GI: Soft.  Genitourinary:  Genitourinary Comments: Not pertinent to current symptomatology therefore not examined.  Musculoskeletal:  . Examination of her right knee reveals 2+ effusion with pain and 2-3+ Lachman.  The knee is stable to varus, valgus and posterior stress with normal patellar tracking.   Neurological: She is alert and oriented to person, place, and time.  Skin: Skin is warm and dry.  Psychiatric: She has a normal mood and affect.     Assessment Principal Problem:   Complete tear of right ACL Active Problems:   BIPOLAR DISORDER UNSPECIFIED   Fatigue   WEIGHT GAIN   Acute medial meniscal  injury of right knee   Plan Right knee arthroscopy with ACL reconstruction using allograft and partial medial meniscectomy.   The risks, benefits, and possible complications of the procedure were discussed in detail with the patient.  The patient is without question.  [name] Atisha Hamidi, PA-C 11/07/2017, 2:20 PM

## 2017-11-10 ENCOUNTER — Ambulatory Visit (HOSPITAL_BASED_OUTPATIENT_CLINIC_OR_DEPARTMENT_OTHER)
Admission: RE | Admit: 2017-11-10 | Payer: BLUE CROSS/BLUE SHIELD | Source: Ambulatory Visit | Admitting: Orthopedic Surgery

## 2017-11-10 ENCOUNTER — Encounter (HOSPITAL_BASED_OUTPATIENT_CLINIC_OR_DEPARTMENT_OTHER): Admission: RE | Payer: Self-pay | Source: Ambulatory Visit

## 2017-11-10 DIAGNOSIS — S83241A Other tear of medial meniscus, current injury, right knee, initial encounter: Secondary | ICD-10-CM | POA: Diagnosis not present

## 2017-11-10 DIAGNOSIS — S83281A Other tear of lateral meniscus, current injury, right knee, initial encounter: Secondary | ICD-10-CM | POA: Diagnosis not present

## 2017-11-10 DIAGNOSIS — M94261 Chondromalacia, right knee: Secondary | ICD-10-CM | POA: Diagnosis not present

## 2017-11-10 DIAGNOSIS — X58XXXA Exposure to other specified factors, initial encounter: Secondary | ICD-10-CM | POA: Diagnosis not present

## 2017-11-10 DIAGNOSIS — S83511A Sprain of anterior cruciate ligament of right knee, initial encounter: Secondary | ICD-10-CM | POA: Diagnosis not present

## 2017-11-10 DIAGNOSIS — X501XXA Overexertion from prolonged static or awkward postures, initial encounter: Secondary | ICD-10-CM | POA: Diagnosis not present

## 2017-11-10 DIAGNOSIS — S83231A Complex tear of medial meniscus, current injury, right knee, initial encounter: Secondary | ICD-10-CM | POA: Diagnosis not present

## 2017-11-10 DIAGNOSIS — G8918 Other acute postprocedural pain: Secondary | ICD-10-CM | POA: Diagnosis not present

## 2017-11-10 HISTORY — DX: Sprain of other specified parts of right knee, initial encounter: S83.8X1A

## 2017-11-10 HISTORY — DX: Sprain of anterior cruciate ligament of right knee, initial encounter: S83.511A

## 2017-11-10 SURGERY — REPAIR, KNEE, ACL
Anesthesia: Choice | Laterality: Right

## 2017-11-17 DIAGNOSIS — S83281D Other tear of lateral meniscus, current injury, right knee, subsequent encounter: Secondary | ICD-10-CM | POA: Diagnosis not present

## 2017-11-17 DIAGNOSIS — S83241D Other tear of medial meniscus, current injury, right knee, subsequent encounter: Secondary | ICD-10-CM | POA: Diagnosis not present

## 2017-11-18 DIAGNOSIS — F3112 Bipolar disorder, current episode manic without psychotic features, moderate: Secondary | ICD-10-CM | POA: Diagnosis not present

## 2017-11-20 DIAGNOSIS — M25661 Stiffness of right knee, not elsewhere classified: Secondary | ICD-10-CM | POA: Diagnosis not present

## 2017-11-20 DIAGNOSIS — M6281 Muscle weakness (generalized): Secondary | ICD-10-CM | POA: Diagnosis not present

## 2017-11-20 DIAGNOSIS — R262 Difficulty in walking, not elsewhere classified: Secondary | ICD-10-CM | POA: Diagnosis not present

## 2017-11-20 DIAGNOSIS — M23611 Other spontaneous disruption of anterior cruciate ligament of right knee: Secondary | ICD-10-CM | POA: Diagnosis not present

## 2017-11-24 DIAGNOSIS — S83281D Other tear of lateral meniscus, current injury, right knee, subsequent encounter: Secondary | ICD-10-CM | POA: Diagnosis not present

## 2017-11-24 DIAGNOSIS — S83241D Other tear of medial meniscus, current injury, right knee, subsequent encounter: Secondary | ICD-10-CM | POA: Diagnosis not present

## 2017-11-27 DIAGNOSIS — M25661 Stiffness of right knee, not elsewhere classified: Secondary | ICD-10-CM | POA: Diagnosis not present

## 2017-11-27 DIAGNOSIS — M25561 Pain in right knee: Secondary | ICD-10-CM | POA: Diagnosis not present

## 2017-11-27 DIAGNOSIS — M6281 Muscle weakness (generalized): Secondary | ICD-10-CM | POA: Diagnosis not present

## 2017-11-27 DIAGNOSIS — R262 Difficulty in walking, not elsewhere classified: Secondary | ICD-10-CM | POA: Diagnosis not present

## 2017-12-02 DIAGNOSIS — R262 Difficulty in walking, not elsewhere classified: Secondary | ICD-10-CM | POA: Diagnosis not present

## 2017-12-02 DIAGNOSIS — M6281 Muscle weakness (generalized): Secondary | ICD-10-CM | POA: Diagnosis not present

## 2017-12-02 DIAGNOSIS — F3112 Bipolar disorder, current episode manic without psychotic features, moderate: Secondary | ICD-10-CM | POA: Diagnosis not present

## 2017-12-02 DIAGNOSIS — M25661 Stiffness of right knee, not elsewhere classified: Secondary | ICD-10-CM | POA: Diagnosis not present

## 2017-12-02 DIAGNOSIS — M25561 Pain in right knee: Secondary | ICD-10-CM | POA: Diagnosis not present

## 2017-12-05 DIAGNOSIS — M6281 Muscle weakness (generalized): Secondary | ICD-10-CM | POA: Diagnosis not present

## 2017-12-05 DIAGNOSIS — M25661 Stiffness of right knee, not elsewhere classified: Secondary | ICD-10-CM | POA: Diagnosis not present

## 2017-12-05 DIAGNOSIS — M25561 Pain in right knee: Secondary | ICD-10-CM | POA: Diagnosis not present

## 2017-12-05 DIAGNOSIS — R262 Difficulty in walking, not elsewhere classified: Secondary | ICD-10-CM | POA: Diagnosis not present

## 2017-12-09 DIAGNOSIS — M6281 Muscle weakness (generalized): Secondary | ICD-10-CM | POA: Diagnosis not present

## 2017-12-09 DIAGNOSIS — F3112 Bipolar disorder, current episode manic without psychotic features, moderate: Secondary | ICD-10-CM | POA: Diagnosis not present

## 2017-12-09 DIAGNOSIS — R262 Difficulty in walking, not elsewhere classified: Secondary | ICD-10-CM | POA: Diagnosis not present

## 2017-12-09 DIAGNOSIS — M25561 Pain in right knee: Secondary | ICD-10-CM | POA: Diagnosis not present

## 2017-12-09 DIAGNOSIS — M25661 Stiffness of right knee, not elsewhere classified: Secondary | ICD-10-CM | POA: Diagnosis not present

## 2017-12-11 DIAGNOSIS — M25661 Stiffness of right knee, not elsewhere classified: Secondary | ICD-10-CM | POA: Diagnosis not present

## 2017-12-11 DIAGNOSIS — M6281 Muscle weakness (generalized): Secondary | ICD-10-CM | POA: Diagnosis not present

## 2017-12-11 DIAGNOSIS — R262 Difficulty in walking, not elsewhere classified: Secondary | ICD-10-CM | POA: Diagnosis not present

## 2017-12-11 DIAGNOSIS — M25561 Pain in right knee: Secondary | ICD-10-CM | POA: Diagnosis not present

## 2017-12-15 DIAGNOSIS — R262 Difficulty in walking, not elsewhere classified: Secondary | ICD-10-CM | POA: Diagnosis not present

## 2017-12-15 DIAGNOSIS — M25561 Pain in right knee: Secondary | ICD-10-CM | POA: Diagnosis not present

## 2017-12-15 DIAGNOSIS — M25661 Stiffness of right knee, not elsewhere classified: Secondary | ICD-10-CM | POA: Diagnosis not present

## 2017-12-15 DIAGNOSIS — M6281 Muscle weakness (generalized): Secondary | ICD-10-CM | POA: Diagnosis not present

## 2017-12-16 DIAGNOSIS — F3112 Bipolar disorder, current episode manic without psychotic features, moderate: Secondary | ICD-10-CM | POA: Diagnosis not present

## 2017-12-18 DIAGNOSIS — R262 Difficulty in walking, not elsewhere classified: Secondary | ICD-10-CM | POA: Diagnosis not present

## 2017-12-18 DIAGNOSIS — M25561 Pain in right knee: Secondary | ICD-10-CM | POA: Diagnosis not present

## 2017-12-18 DIAGNOSIS — M25661 Stiffness of right knee, not elsewhere classified: Secondary | ICD-10-CM | POA: Diagnosis not present

## 2017-12-18 DIAGNOSIS — M6281 Muscle weakness (generalized): Secondary | ICD-10-CM | POA: Diagnosis not present

## 2017-12-22 DIAGNOSIS — M25561 Pain in right knee: Secondary | ICD-10-CM | POA: Diagnosis not present

## 2017-12-22 DIAGNOSIS — M25661 Stiffness of right knee, not elsewhere classified: Secondary | ICD-10-CM | POA: Diagnosis not present

## 2017-12-22 DIAGNOSIS — M6281 Muscle weakness (generalized): Secondary | ICD-10-CM | POA: Diagnosis not present

## 2017-12-22 DIAGNOSIS — R262 Difficulty in walking, not elsewhere classified: Secondary | ICD-10-CM | POA: Diagnosis not present

## 2017-12-30 DIAGNOSIS — R739 Hyperglycemia, unspecified: Secondary | ICD-10-CM | POA: Diagnosis not present

## 2017-12-30 DIAGNOSIS — Z5181 Encounter for therapeutic drug level monitoring: Secondary | ICD-10-CM | POA: Diagnosis not present

## 2017-12-30 DIAGNOSIS — W57XXXA Bitten or stung by nonvenomous insect and other nonvenomous arthropods, initial encounter: Secondary | ICD-10-CM | POA: Diagnosis not present

## 2017-12-30 DIAGNOSIS — R262 Difficulty in walking, not elsewhere classified: Secondary | ICD-10-CM | POA: Diagnosis not present

## 2017-12-30 DIAGNOSIS — F3112 Bipolar disorder, current episode manic without psychotic features, moderate: Secondary | ICD-10-CM | POA: Diagnosis not present

## 2017-12-30 DIAGNOSIS — E785 Hyperlipidemia, unspecified: Secondary | ICD-10-CM | POA: Diagnosis not present

## 2017-12-30 DIAGNOSIS — H6123 Impacted cerumen, bilateral: Secondary | ICD-10-CM | POA: Diagnosis not present

## 2017-12-30 DIAGNOSIS — M25661 Stiffness of right knee, not elsewhere classified: Secondary | ICD-10-CM | POA: Diagnosis not present

## 2017-12-30 DIAGNOSIS — Z23 Encounter for immunization: Secondary | ICD-10-CM | POA: Diagnosis not present

## 2017-12-30 DIAGNOSIS — M6281 Muscle weakness (generalized): Secondary | ICD-10-CM | POA: Diagnosis not present

## 2017-12-30 DIAGNOSIS — M25561 Pain in right knee: Secondary | ICD-10-CM | POA: Diagnosis not present

## 2018-01-01 DIAGNOSIS — M25561 Pain in right knee: Secondary | ICD-10-CM | POA: Diagnosis not present

## 2018-01-01 DIAGNOSIS — M6281 Muscle weakness (generalized): Secondary | ICD-10-CM | POA: Diagnosis not present

## 2018-01-01 DIAGNOSIS — F3131 Bipolar disorder, current episode depressed, mild: Secondary | ICD-10-CM | POA: Diagnosis not present

## 2018-01-01 DIAGNOSIS — M25661 Stiffness of right knee, not elsewhere classified: Secondary | ICD-10-CM | POA: Diagnosis not present

## 2018-01-01 DIAGNOSIS — R262 Difficulty in walking, not elsewhere classified: Secondary | ICD-10-CM | POA: Diagnosis not present

## 2018-01-02 DIAGNOSIS — Z23 Encounter for immunization: Secondary | ICD-10-CM | POA: Diagnosis not present

## 2018-01-06 DIAGNOSIS — R262 Difficulty in walking, not elsewhere classified: Secondary | ICD-10-CM | POA: Diagnosis not present

## 2018-01-06 DIAGNOSIS — M25561 Pain in right knee: Secondary | ICD-10-CM | POA: Diagnosis not present

## 2018-01-06 DIAGNOSIS — M6281 Muscle weakness (generalized): Secondary | ICD-10-CM | POA: Diagnosis not present

## 2018-01-06 DIAGNOSIS — M25661 Stiffness of right knee, not elsewhere classified: Secondary | ICD-10-CM | POA: Diagnosis not present

## 2018-01-07 DIAGNOSIS — Z0000 Encounter for general adult medical examination without abnormal findings: Secondary | ICD-10-CM | POA: Diagnosis not present

## 2018-01-07 DIAGNOSIS — F330 Major depressive disorder, recurrent, mild: Secondary | ICD-10-CM | POA: Diagnosis not present

## 2018-01-07 DIAGNOSIS — G4733 Obstructive sleep apnea (adult) (pediatric): Secondary | ICD-10-CM | POA: Diagnosis not present

## 2018-01-07 DIAGNOSIS — F319 Bipolar disorder, unspecified: Secondary | ICD-10-CM | POA: Diagnosis not present

## 2018-01-07 DIAGNOSIS — F3112 Bipolar disorder, current episode manic without psychotic features, moderate: Secondary | ICD-10-CM | POA: Diagnosis not present

## 2018-01-08 DIAGNOSIS — R262 Difficulty in walking, not elsewhere classified: Secondary | ICD-10-CM | POA: Diagnosis not present

## 2018-01-08 DIAGNOSIS — M25661 Stiffness of right knee, not elsewhere classified: Secondary | ICD-10-CM | POA: Diagnosis not present

## 2018-01-08 DIAGNOSIS — M6281 Muscle weakness (generalized): Secondary | ICD-10-CM | POA: Diagnosis not present

## 2018-01-08 DIAGNOSIS — M25561 Pain in right knee: Secondary | ICD-10-CM | POA: Diagnosis not present

## 2018-01-12 DIAGNOSIS — R262 Difficulty in walking, not elsewhere classified: Secondary | ICD-10-CM | POA: Diagnosis not present

## 2018-01-12 DIAGNOSIS — M25661 Stiffness of right knee, not elsewhere classified: Secondary | ICD-10-CM | POA: Diagnosis not present

## 2018-01-12 DIAGNOSIS — M6281 Muscle weakness (generalized): Secondary | ICD-10-CM | POA: Diagnosis not present

## 2018-01-12 DIAGNOSIS — M25561 Pain in right knee: Secondary | ICD-10-CM | POA: Diagnosis not present

## 2018-01-14 DIAGNOSIS — F3112 Bipolar disorder, current episode manic without psychotic features, moderate: Secondary | ICD-10-CM | POA: Diagnosis not present

## 2018-01-14 DIAGNOSIS — M25661 Stiffness of right knee, not elsewhere classified: Secondary | ICD-10-CM | POA: Diagnosis not present

## 2018-01-14 DIAGNOSIS — M6281 Muscle weakness (generalized): Secondary | ICD-10-CM | POA: Diagnosis not present

## 2018-01-14 DIAGNOSIS — R262 Difficulty in walking, not elsewhere classified: Secondary | ICD-10-CM | POA: Diagnosis not present

## 2018-01-14 DIAGNOSIS — M25561 Pain in right knee: Secondary | ICD-10-CM | POA: Diagnosis not present

## 2018-01-19 DIAGNOSIS — R262 Difficulty in walking, not elsewhere classified: Secondary | ICD-10-CM | POA: Diagnosis not present

## 2018-01-19 DIAGNOSIS — M25661 Stiffness of right knee, not elsewhere classified: Secondary | ICD-10-CM | POA: Diagnosis not present

## 2018-01-19 DIAGNOSIS — M6281 Muscle weakness (generalized): Secondary | ICD-10-CM | POA: Diagnosis not present

## 2018-01-19 DIAGNOSIS — M25561 Pain in right knee: Secondary | ICD-10-CM | POA: Diagnosis not present

## 2018-01-21 DIAGNOSIS — M25561 Pain in right knee: Secondary | ICD-10-CM | POA: Diagnosis not present

## 2018-01-21 DIAGNOSIS — F3112 Bipolar disorder, current episode manic without psychotic features, moderate: Secondary | ICD-10-CM | POA: Diagnosis not present

## 2018-01-21 DIAGNOSIS — M6281 Muscle weakness (generalized): Secondary | ICD-10-CM | POA: Diagnosis not present

## 2018-01-21 DIAGNOSIS — R262 Difficulty in walking, not elsewhere classified: Secondary | ICD-10-CM | POA: Diagnosis not present

## 2018-01-21 DIAGNOSIS — M25661 Stiffness of right knee, not elsewhere classified: Secondary | ICD-10-CM | POA: Diagnosis not present

## 2018-01-27 DIAGNOSIS — M6281 Muscle weakness (generalized): Secondary | ICD-10-CM | POA: Diagnosis not present

## 2018-01-27 DIAGNOSIS — M25661 Stiffness of right knee, not elsewhere classified: Secondary | ICD-10-CM | POA: Diagnosis not present

## 2018-01-27 DIAGNOSIS — M25561 Pain in right knee: Secondary | ICD-10-CM | POA: Diagnosis not present

## 2018-01-27 DIAGNOSIS — R262 Difficulty in walking, not elsewhere classified: Secondary | ICD-10-CM | POA: Diagnosis not present

## 2018-01-30 DIAGNOSIS — F3181 Bipolar II disorder: Secondary | ICD-10-CM | POA: Diagnosis not present

## 2018-02-02 DIAGNOSIS — M25661 Stiffness of right knee, not elsewhere classified: Secondary | ICD-10-CM | POA: Diagnosis not present

## 2018-02-02 DIAGNOSIS — M6281 Muscle weakness (generalized): Secondary | ICD-10-CM | POA: Diagnosis not present

## 2018-02-02 DIAGNOSIS — R262 Difficulty in walking, not elsewhere classified: Secondary | ICD-10-CM | POA: Diagnosis not present

## 2018-02-02 DIAGNOSIS — M25561 Pain in right knee: Secondary | ICD-10-CM | POA: Diagnosis not present

## 2018-02-16 DIAGNOSIS — M6281 Muscle weakness (generalized): Secondary | ICD-10-CM | POA: Diagnosis not present

## 2018-02-16 DIAGNOSIS — M25561 Pain in right knee: Secondary | ICD-10-CM | POA: Diagnosis not present

## 2018-02-16 DIAGNOSIS — M25661 Stiffness of right knee, not elsewhere classified: Secondary | ICD-10-CM | POA: Diagnosis not present

## 2018-02-16 DIAGNOSIS — R262 Difficulty in walking, not elsewhere classified: Secondary | ICD-10-CM | POA: Diagnosis not present

## 2018-02-17 DIAGNOSIS — F3112 Bipolar disorder, current episode manic without psychotic features, moderate: Secondary | ICD-10-CM | POA: Diagnosis not present

## 2018-02-26 DIAGNOSIS — M6281 Muscle weakness (generalized): Secondary | ICD-10-CM | POA: Diagnosis not present

## 2018-02-26 DIAGNOSIS — M25661 Stiffness of right knee, not elsewhere classified: Secondary | ICD-10-CM | POA: Diagnosis not present

## 2018-02-26 DIAGNOSIS — M25561 Pain in right knee: Secondary | ICD-10-CM | POA: Diagnosis not present

## 2018-02-26 DIAGNOSIS — R262 Difficulty in walking, not elsewhere classified: Secondary | ICD-10-CM | POA: Diagnosis not present

## 2018-03-03 DIAGNOSIS — S83511A Sprain of anterior cruciate ligament of right knee, initial encounter: Secondary | ICD-10-CM | POA: Diagnosis not present

## 2018-03-03 DIAGNOSIS — R262 Difficulty in walking, not elsewhere classified: Secondary | ICD-10-CM | POA: Diagnosis not present

## 2018-03-03 DIAGNOSIS — M25561 Pain in right knee: Secondary | ICD-10-CM | POA: Diagnosis not present

## 2018-03-03 DIAGNOSIS — M25661 Stiffness of right knee, not elsewhere classified: Secondary | ICD-10-CM | POA: Diagnosis not present

## 2018-03-03 DIAGNOSIS — F3112 Bipolar disorder, current episode manic without psychotic features, moderate: Secondary | ICD-10-CM | POA: Diagnosis not present

## 2018-03-03 DIAGNOSIS — M6281 Muscle weakness (generalized): Secondary | ICD-10-CM | POA: Diagnosis not present

## 2018-03-11 DIAGNOSIS — M6281 Muscle weakness (generalized): Secondary | ICD-10-CM | POA: Diagnosis not present

## 2018-03-11 DIAGNOSIS — M25661 Stiffness of right knee, not elsewhere classified: Secondary | ICD-10-CM | POA: Diagnosis not present

## 2018-03-11 DIAGNOSIS — R262 Difficulty in walking, not elsewhere classified: Secondary | ICD-10-CM | POA: Diagnosis not present

## 2018-03-11 DIAGNOSIS — M25561 Pain in right knee: Secondary | ICD-10-CM | POA: Diagnosis not present

## 2018-03-13 DIAGNOSIS — F3112 Bipolar disorder, current episode manic without psychotic features, moderate: Secondary | ICD-10-CM | POA: Diagnosis not present

## 2018-03-16 DIAGNOSIS — M25661 Stiffness of right knee, not elsewhere classified: Secondary | ICD-10-CM | POA: Diagnosis not present

## 2018-03-16 DIAGNOSIS — M25561 Pain in right knee: Secondary | ICD-10-CM | POA: Diagnosis not present

## 2018-03-16 DIAGNOSIS — M6281 Muscle weakness (generalized): Secondary | ICD-10-CM | POA: Diagnosis not present

## 2018-03-16 DIAGNOSIS — R262 Difficulty in walking, not elsewhere classified: Secondary | ICD-10-CM | POA: Diagnosis not present

## 2018-03-20 DIAGNOSIS — F3112 Bipolar disorder, current episode manic without psychotic features, moderate: Secondary | ICD-10-CM | POA: Diagnosis not present

## 2018-03-26 DIAGNOSIS — M25661 Stiffness of right knee, not elsewhere classified: Secondary | ICD-10-CM | POA: Diagnosis not present

## 2018-03-26 DIAGNOSIS — M25561 Pain in right knee: Secondary | ICD-10-CM | POA: Diagnosis not present

## 2018-03-26 DIAGNOSIS — M6281 Muscle weakness (generalized): Secondary | ICD-10-CM | POA: Diagnosis not present

## 2018-03-26 DIAGNOSIS — R262 Difficulty in walking, not elsewhere classified: Secondary | ICD-10-CM | POA: Diagnosis not present

## 2018-03-27 DIAGNOSIS — F3131 Bipolar disorder, current episode depressed, mild: Secondary | ICD-10-CM | POA: Diagnosis not present

## 2018-04-07 DIAGNOSIS — E785 Hyperlipidemia, unspecified: Secondary | ICD-10-CM | POA: Diagnosis not present

## 2018-04-08 DIAGNOSIS — F3131 Bipolar disorder, current episode depressed, mild: Secondary | ICD-10-CM | POA: Diagnosis not present

## 2018-04-13 DIAGNOSIS — M25561 Pain in right knee: Secondary | ICD-10-CM | POA: Diagnosis not present

## 2018-04-14 DIAGNOSIS — F3112 Bipolar disorder, current episode manic without psychotic features, moderate: Secondary | ICD-10-CM | POA: Diagnosis not present

## 2018-04-22 DIAGNOSIS — H04123 Dry eye syndrome of bilateral lacrimal glands: Secondary | ICD-10-CM | POA: Diagnosis not present

## 2018-04-22 DIAGNOSIS — H40013 Open angle with borderline findings, low risk, bilateral: Secondary | ICD-10-CM | POA: Diagnosis not present

## 2018-04-22 DIAGNOSIS — H18822 Corneal disorder due to contact lens, left eye: Secondary | ICD-10-CM | POA: Diagnosis not present

## 2018-04-22 DIAGNOSIS — H2513 Age-related nuclear cataract, bilateral: Secondary | ICD-10-CM | POA: Diagnosis not present

## 2018-05-05 DIAGNOSIS — F3181 Bipolar II disorder: Secondary | ICD-10-CM | POA: Diagnosis not present

## 2018-05-07 DIAGNOSIS — F3112 Bipolar disorder, current episode manic without psychotic features, moderate: Secondary | ICD-10-CM | POA: Diagnosis not present

## 2018-05-20 DIAGNOSIS — F3112 Bipolar disorder, current episode manic without psychotic features, moderate: Secondary | ICD-10-CM | POA: Diagnosis not present

## 2018-06-02 DIAGNOSIS — F3112 Bipolar disorder, current episode manic without psychotic features, moderate: Secondary | ICD-10-CM | POA: Diagnosis not present

## 2018-06-16 DIAGNOSIS — F3112 Bipolar disorder, current episode manic without psychotic features, moderate: Secondary | ICD-10-CM | POA: Diagnosis not present

## 2018-06-30 DIAGNOSIS — F3112 Bipolar disorder, current episode manic without psychotic features, moderate: Secondary | ICD-10-CM | POA: Diagnosis not present

## 2018-06-30 DIAGNOSIS — N898 Other specified noninflammatory disorders of vagina: Secondary | ICD-10-CM | POA: Diagnosis not present

## 2018-07-14 DIAGNOSIS — F3112 Bipolar disorder, current episode manic without psychotic features, moderate: Secondary | ICD-10-CM | POA: Diagnosis not present

## 2018-08-04 DIAGNOSIS — F3112 Bipolar disorder, current episode manic without psychotic features, moderate: Secondary | ICD-10-CM | POA: Diagnosis not present

## 2018-08-11 DIAGNOSIS — F3112 Bipolar disorder, current episode manic without psychotic features, moderate: Secondary | ICD-10-CM | POA: Diagnosis not present

## 2018-08-25 DIAGNOSIS — F3112 Bipolar disorder, current episode manic without psychotic features, moderate: Secondary | ICD-10-CM | POA: Diagnosis not present

## 2018-09-08 DIAGNOSIS — F3112 Bipolar disorder, current episode manic without psychotic features, moderate: Secondary | ICD-10-CM | POA: Diagnosis not present

## 2018-09-22 DIAGNOSIS — F3112 Bipolar disorder, current episode manic without psychotic features, moderate: Secondary | ICD-10-CM | POA: Diagnosis not present

## 2018-10-06 DIAGNOSIS — F3112 Bipolar disorder, current episode manic without psychotic features, moderate: Secondary | ICD-10-CM | POA: Diagnosis not present

## 2018-10-19 DIAGNOSIS — F3112 Bipolar disorder, current episode manic without psychotic features, moderate: Secondary | ICD-10-CM | POA: Diagnosis not present

## 2018-11-03 DIAGNOSIS — F3112 Bipolar disorder, current episode manic without psychotic features, moderate: Secondary | ICD-10-CM | POA: Diagnosis not present

## 2018-11-04 DIAGNOSIS — F900 Attention-deficit hyperactivity disorder, predominantly inattentive type: Secondary | ICD-10-CM | POA: Diagnosis not present

## 2018-11-04 DIAGNOSIS — F3189 Other bipolar disorder: Secondary | ICD-10-CM | POA: Diagnosis not present

## 2018-11-17 DIAGNOSIS — F3112 Bipolar disorder, current episode manic without psychotic features, moderate: Secondary | ICD-10-CM | POA: Diagnosis not present

## 2018-12-01 DIAGNOSIS — F3112 Bipolar disorder, current episode manic without psychotic features, moderate: Secondary | ICD-10-CM | POA: Diagnosis not present

## 2018-12-15 DIAGNOSIS — F3112 Bipolar disorder, current episode manic without psychotic features, moderate: Secondary | ICD-10-CM | POA: Diagnosis not present

## 2018-12-29 DIAGNOSIS — F3112 Bipolar disorder, current episode manic without psychotic features, moderate: Secondary | ICD-10-CM | POA: Diagnosis not present

## 2019-01-12 DIAGNOSIS — F3112 Bipolar disorder, current episode manic without psychotic features, moderate: Secondary | ICD-10-CM | POA: Diagnosis not present

## 2019-01-26 DIAGNOSIS — F3112 Bipolar disorder, current episode manic without psychotic features, moderate: Secondary | ICD-10-CM | POA: Diagnosis not present

## 2019-02-01 DIAGNOSIS — F332 Major depressive disorder, recurrent severe without psychotic features: Secondary | ICD-10-CM | POA: Diagnosis not present

## 2019-02-09 DIAGNOSIS — F3112 Bipolar disorder, current episode manic without psychotic features, moderate: Secondary | ICD-10-CM | POA: Diagnosis not present

## 2019-02-24 DIAGNOSIS — F3112 Bipolar disorder, current episode manic without psychotic features, moderate: Secondary | ICD-10-CM | POA: Diagnosis not present

## 2019-02-25 DIAGNOSIS — R519 Headache, unspecified: Secondary | ICD-10-CM | POA: Diagnosis not present

## 2019-02-25 DIAGNOSIS — R509 Fever, unspecified: Secondary | ICD-10-CM | POA: Diagnosis not present

## 2019-03-09 DIAGNOSIS — F3112 Bipolar disorder, current episode manic without psychotic features, moderate: Secondary | ICD-10-CM | POA: Diagnosis not present

## 2019-03-18 DIAGNOSIS — F410 Panic disorder [episodic paroxysmal anxiety] without agoraphobia: Secondary | ICD-10-CM | POA: Diagnosis not present

## 2019-03-18 DIAGNOSIS — F3189 Other bipolar disorder: Secondary | ICD-10-CM | POA: Diagnosis not present

## 2019-03-24 DIAGNOSIS — F3112 Bipolar disorder, current episode manic without psychotic features, moderate: Secondary | ICD-10-CM | POA: Diagnosis not present

## 2019-04-06 DIAGNOSIS — F3112 Bipolar disorder, current episode manic without psychotic features, moderate: Secondary | ICD-10-CM | POA: Diagnosis not present

## 2019-04-20 DIAGNOSIS — F3112 Bipolar disorder, current episode manic without psychotic features, moderate: Secondary | ICD-10-CM | POA: Diagnosis not present

## 2019-05-21 DIAGNOSIS — F3112 Bipolar disorder, current episode manic without psychotic features, moderate: Secondary | ICD-10-CM | POA: Diagnosis not present

## 2019-05-25 DIAGNOSIS — F3112 Bipolar disorder, current episode manic without psychotic features, moderate: Secondary | ICD-10-CM | POA: Diagnosis not present

## 2019-06-08 DIAGNOSIS — F3112 Bipolar disorder, current episode manic without psychotic features, moderate: Secondary | ICD-10-CM | POA: Diagnosis not present

## 2019-06-22 DIAGNOSIS — F3112 Bipolar disorder, current episode manic without psychotic features, moderate: Secondary | ICD-10-CM | POA: Diagnosis not present

## 2019-07-06 DIAGNOSIS — F3112 Bipolar disorder, current episode manic without psychotic features, moderate: Secondary | ICD-10-CM | POA: Diagnosis not present

## 2019-07-20 DIAGNOSIS — F3112 Bipolar disorder, current episode manic without psychotic features, moderate: Secondary | ICD-10-CM | POA: Diagnosis not present

## 2019-08-03 DIAGNOSIS — F3112 Bipolar disorder, current episode manic without psychotic features, moderate: Secondary | ICD-10-CM | POA: Diagnosis not present

## 2019-08-17 DIAGNOSIS — F3112 Bipolar disorder, current episode manic without psychotic features, moderate: Secondary | ICD-10-CM | POA: Diagnosis not present

## 2019-08-31 DIAGNOSIS — F3112 Bipolar disorder, current episode manic without psychotic features, moderate: Secondary | ICD-10-CM | POA: Diagnosis not present

## 2019-09-20 DIAGNOSIS — F3181 Bipolar II disorder: Secondary | ICD-10-CM | POA: Diagnosis not present

## 2019-10-01 DIAGNOSIS — M797 Fibromyalgia: Secondary | ICD-10-CM | POA: Diagnosis not present

## 2019-10-14 DIAGNOSIS — F411 Generalized anxiety disorder: Secondary | ICD-10-CM | POA: Diagnosis not present

## 2019-10-21 ENCOUNTER — Ambulatory Visit: Payer: Self-pay | Admitting: Podiatry

## 2019-10-26 DIAGNOSIS — F3112 Bipolar disorder, current episode manic without psychotic features, moderate: Secondary | ICD-10-CM | POA: Diagnosis not present

## 2019-11-05 ENCOUNTER — Other Ambulatory Visit: Payer: Self-pay

## 2019-11-05 ENCOUNTER — Ambulatory Visit (INDEPENDENT_AMBULATORY_CARE_PROVIDER_SITE_OTHER): Payer: BC Managed Care – PPO | Admitting: Podiatry

## 2019-11-05 ENCOUNTER — Encounter: Payer: Self-pay | Admitting: Podiatry

## 2019-11-05 ENCOUNTER — Ambulatory Visit (INDEPENDENT_AMBULATORY_CARE_PROVIDER_SITE_OTHER): Payer: BC Managed Care – PPO

## 2019-11-05 DIAGNOSIS — M779 Enthesopathy, unspecified: Secondary | ICD-10-CM

## 2019-11-05 DIAGNOSIS — M2041 Other hammer toe(s) (acquired), right foot: Secondary | ICD-10-CM | POA: Diagnosis not present

## 2019-11-05 DIAGNOSIS — M205X1 Other deformities of toe(s) (acquired), right foot: Secondary | ICD-10-CM

## 2019-11-05 DIAGNOSIS — R52 Pain, unspecified: Secondary | ICD-10-CM | POA: Diagnosis not present

## 2019-11-09 DIAGNOSIS — F3112 Bipolar disorder, current episode manic without psychotic features, moderate: Secondary | ICD-10-CM | POA: Diagnosis not present

## 2019-11-09 NOTE — Progress Notes (Signed)
Subjective:   Patient ID: Jessica Fitzgerald, female   DOB: 59 y.o.   MRN: [id number]   HPI Patient presents stating she has had a lot of pain in the big toe joint right and states she did have ACL surgery.  She is tried insoles from a sports medicine doctor which have not been effective and gradually it seems to be creating more problem.  Patient does not smoke likes to be active   Review of Systems  All other systems reviewed and are negative.       Objective:  Physical Exam Vitals and nursing note reviewed.  Constitutional:      Appearance: She is well-developed.  Pulmonary:     Effort: Pulmonary effort is normal.  Musculoskeletal:        General: Normal range of motion.  Skin:    General: Skin is warm.  Neurological:     Mental Status: She is alert.     Neurovascular status found to be intact muscle strength adequate range of motion within normal limits.  Patient does have inflammation pain around the first MPJ right with inflammation fluid around the joint surface and discomfort with movement of the big toe joint with diminished range of motion.  Patient has good digital perfusion well oriented x3 F2     Assessment:  Inflammatory capsulitis hallux limitus condition right     Plan:  H&P reviewed condition discussed treatment options.  I do think long-term osteotomy will be necessary and ultimately may require fusion or implantation procedure.  Going to try conservative first I did sterile prep injected around the first MPJ right 3 mg Dexasone Kenalog 5 mg Xylocaine advised on rigid bottom shoes and we will have an orthotic made by our ped orthotist with a Morton's extension or reverse Morton's right to try to reduce the stress on the first MPJ  X-rays indicate spurs around the first MPJ right with moderate changes in the lateral joint of the joint surface

## 2019-11-10 DIAGNOSIS — M19071 Primary osteoarthritis, right ankle and foot: Secondary | ICD-10-CM | POA: Diagnosis not present

## 2019-11-10 DIAGNOSIS — M7751 Other enthesopathy of right foot: Secondary | ICD-10-CM | POA: Diagnosis not present

## 2019-11-22 ENCOUNTER — Other Ambulatory Visit: Payer: Self-pay | Admitting: Podiatry

## 2019-11-22 DIAGNOSIS — M779 Enthesopathy, unspecified: Secondary | ICD-10-CM

## 2019-11-23 DIAGNOSIS — F3112 Bipolar disorder, current episode manic without psychotic features, moderate: Secondary | ICD-10-CM | POA: Diagnosis not present

## 2019-11-25 ENCOUNTER — Other Ambulatory Visit: Payer: BC Managed Care – PPO | Admitting: Orthotics

## 2019-12-12 IMAGING — CR DG KNEE COMPLETE 4+V*R*
4 series · 4 of 4 positions shown · non-contrast
Comparison: None.

CLINICAL DATA: 57-year-old female with right knee twisting and
pain.

EXAM:
RIGHT KNEE - COMPLETE 4+ VIEW

[x knee ap right]
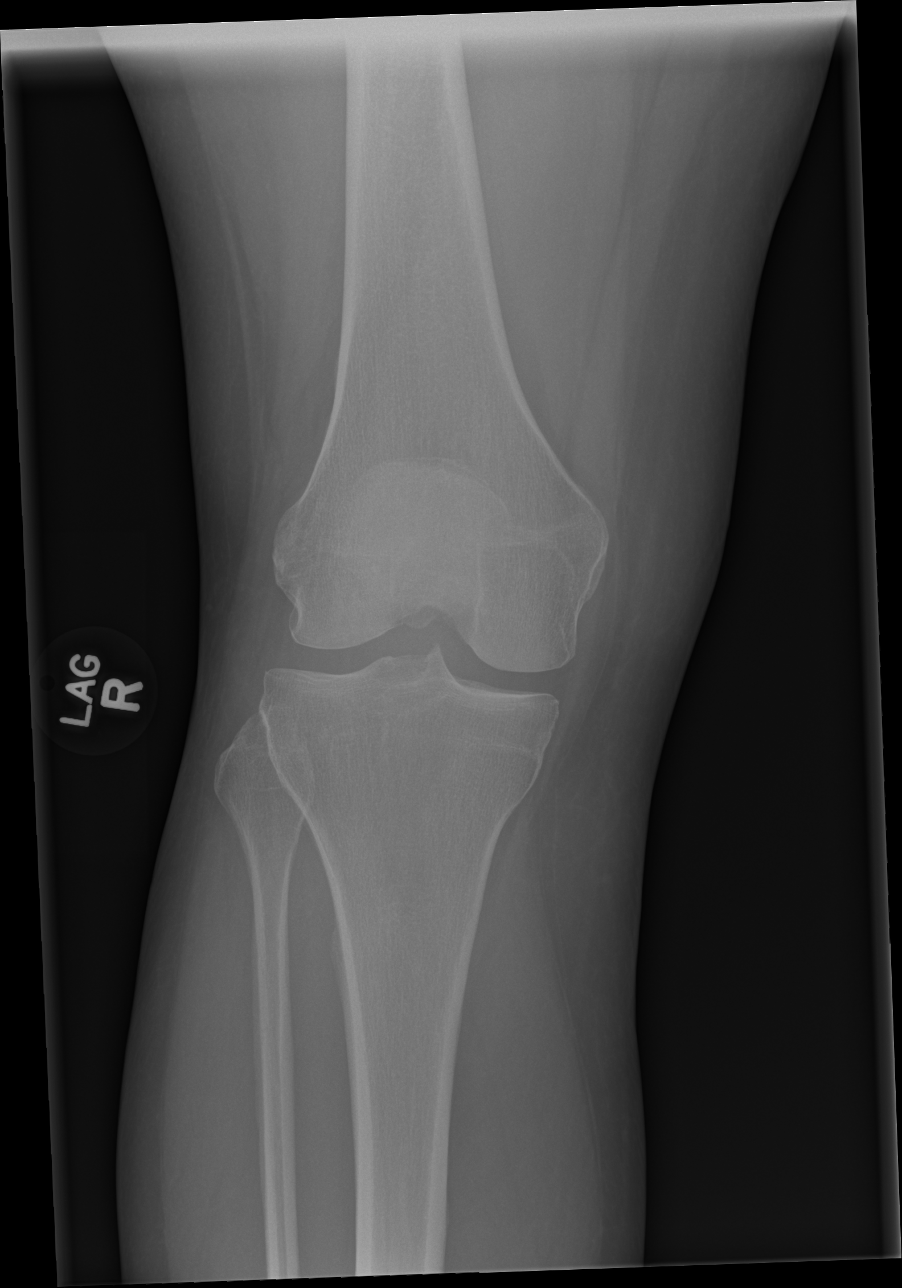

[x knee obl right (1 of 2)]
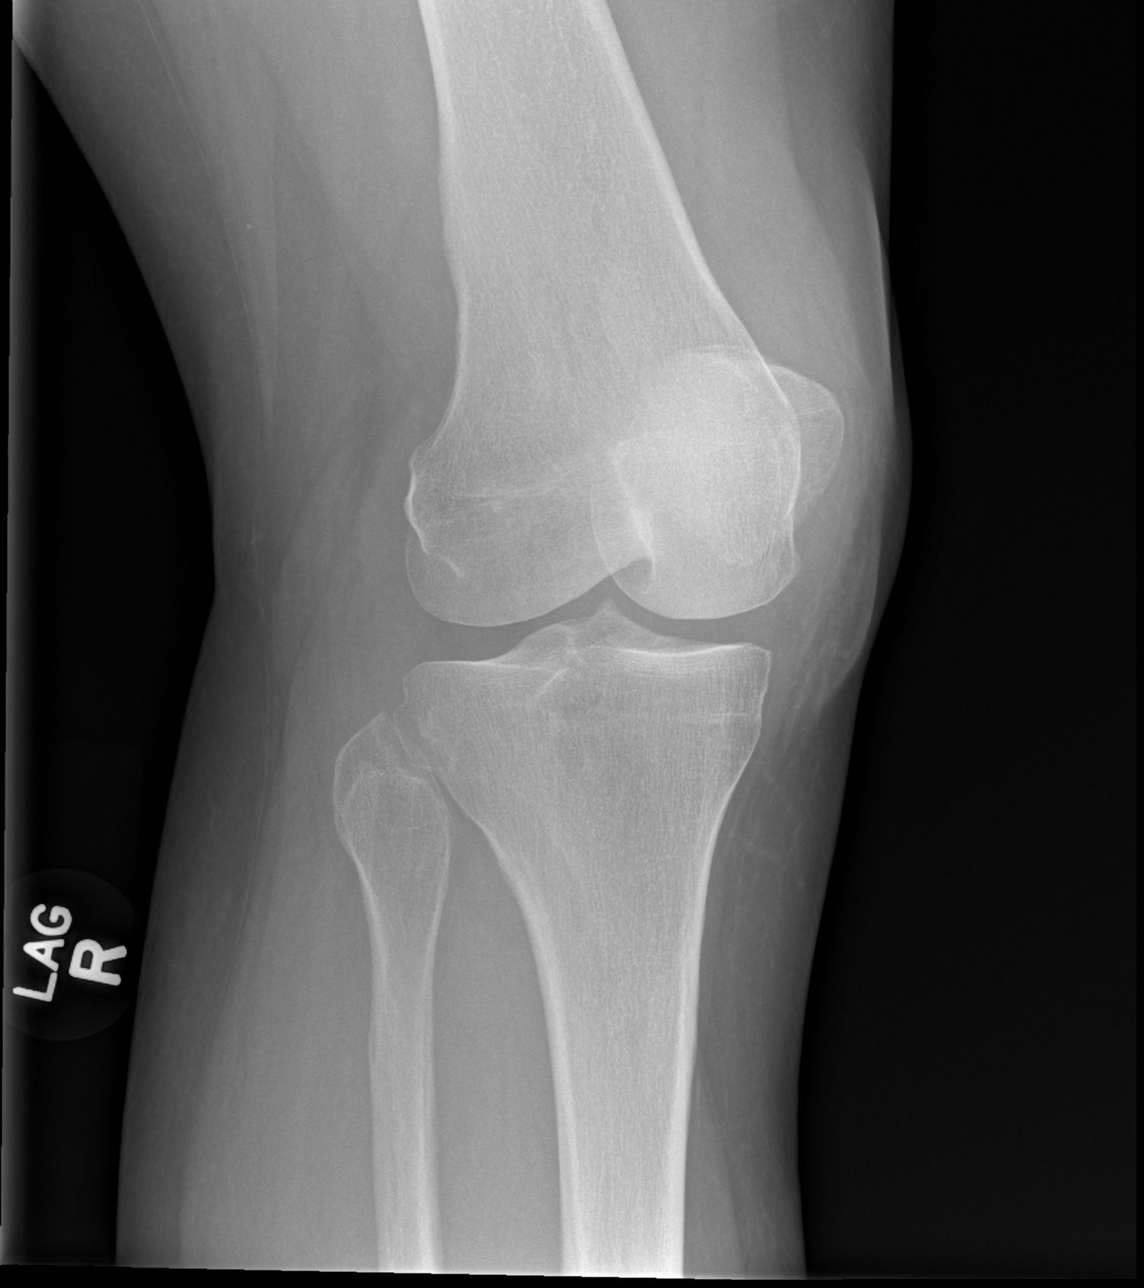

[x knee obl right (2 of 2)]
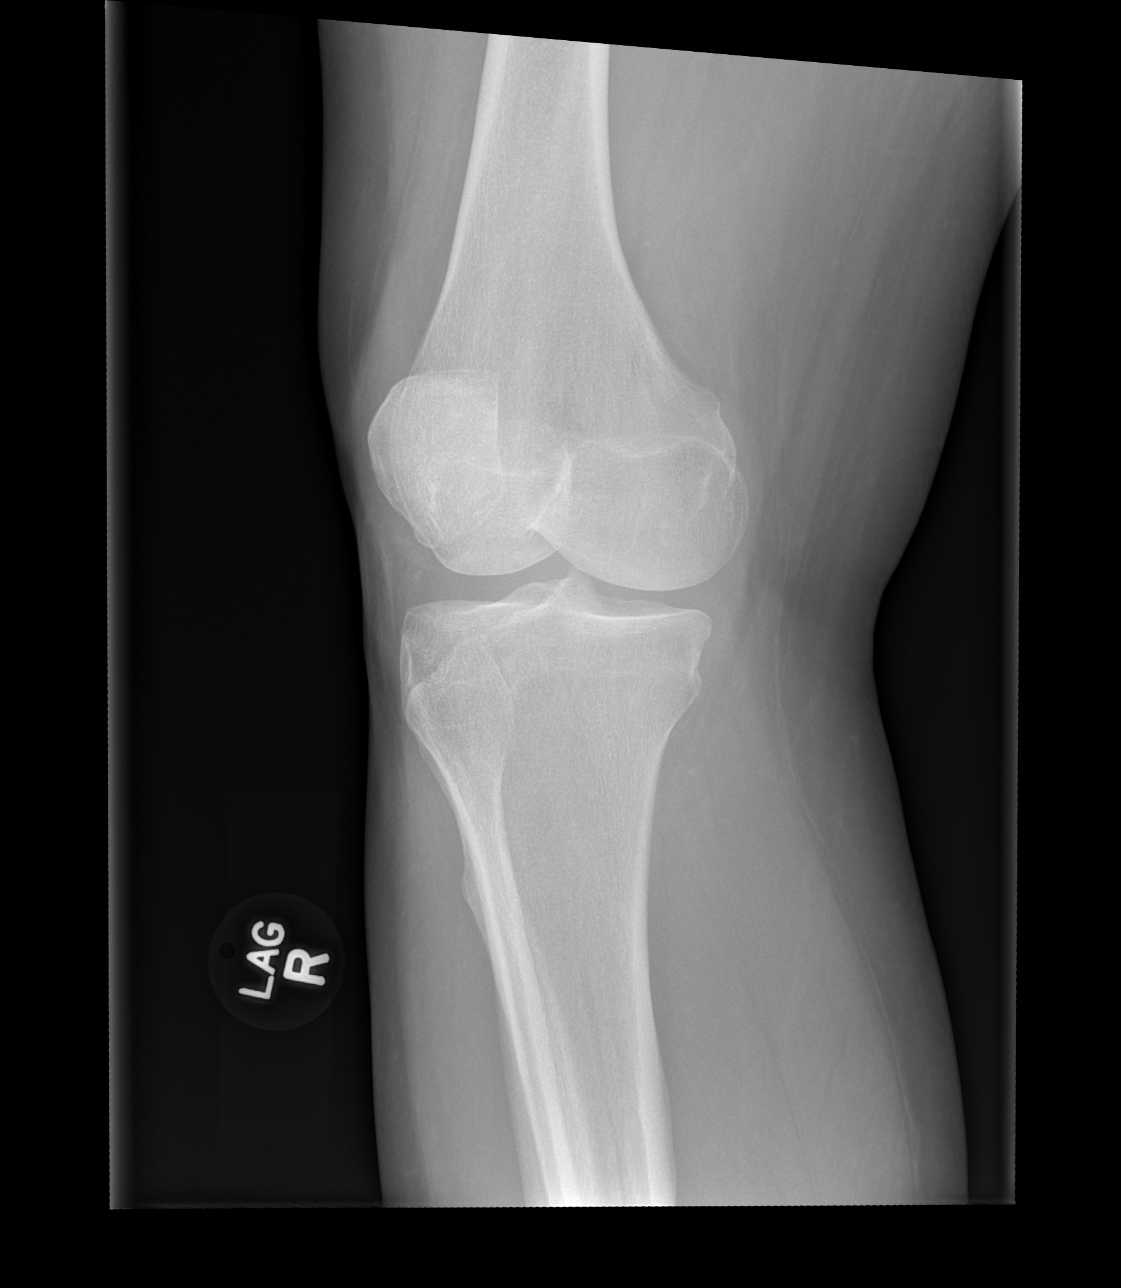

[x knee lat right]
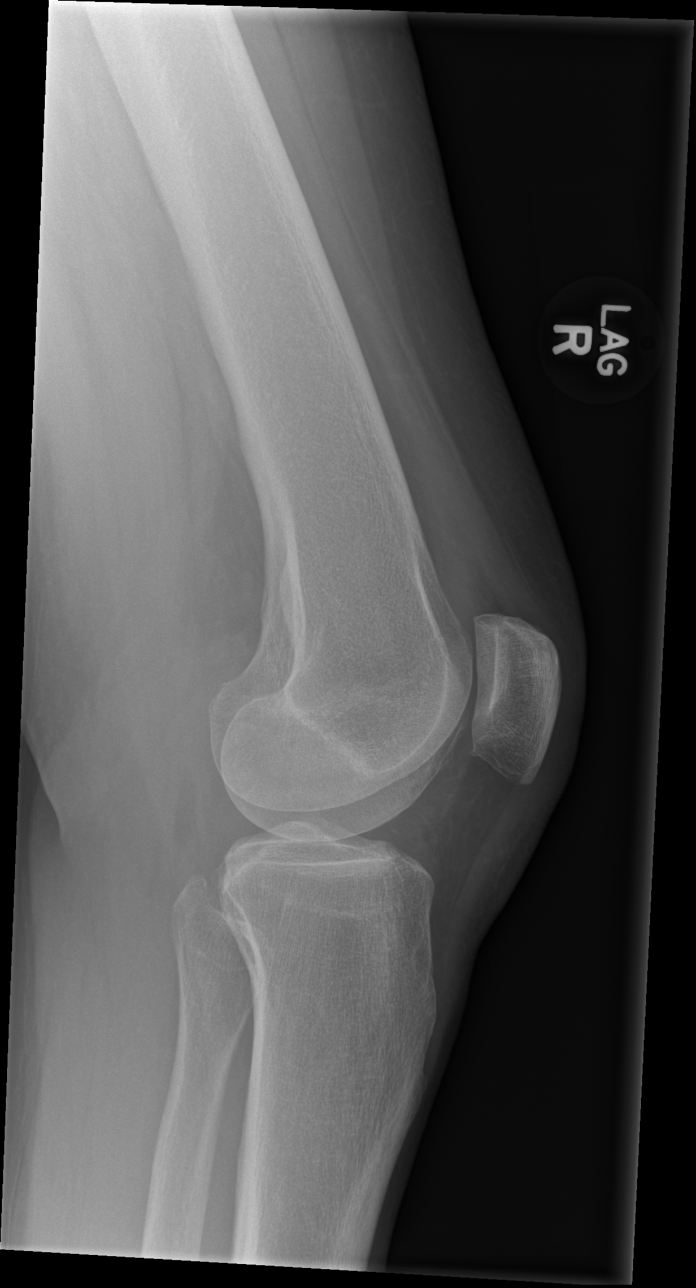

[4 of 4 positions shown; findings below may reference images not displayed]

FINDINGS: There is no acute fracture or dislocation. No significant arthritic
changes. Small suprapatellar effusion. The soft tissues appear
unremarkable.
IMPRESSION: No acute fracture or dislocation.

Small suprapatellar effusion.

## 2019-12-14 DIAGNOSIS — F3112 Bipolar disorder, current episode manic without psychotic features, moderate: Secondary | ICD-10-CM | POA: Diagnosis not present

## 2019-12-28 DIAGNOSIS — F3112 Bipolar disorder, current episode manic without psychotic features, moderate: Secondary | ICD-10-CM | POA: Diagnosis not present

## 2020-01-11 DIAGNOSIS — F3112 Bipolar disorder, current episode manic without psychotic features, moderate: Secondary | ICD-10-CM | POA: Diagnosis not present

## 2020-01-18 DIAGNOSIS — H40013 Open angle with borderline findings, low risk, bilateral: Secondary | ICD-10-CM | POA: Diagnosis not present

## 2020-01-18 DIAGNOSIS — H04123 Dry eye syndrome of bilateral lacrimal glands: Secondary | ICD-10-CM | POA: Diagnosis not present

## 2020-01-18 DIAGNOSIS — H2513 Age-related nuclear cataract, bilateral: Secondary | ICD-10-CM | POA: Diagnosis not present

## 2020-01-19 DIAGNOSIS — M2021 Hallux rigidus, right foot: Secondary | ICD-10-CM | POA: Diagnosis not present

## 2020-01-25 DIAGNOSIS — F3112 Bipolar disorder, current episode manic without psychotic features, moderate: Secondary | ICD-10-CM | POA: Diagnosis not present

## 2020-01-29 DIAGNOSIS — H6123 Impacted cerumen, bilateral: Secondary | ICD-10-CM | POA: Diagnosis not present

## 2020-02-04 DIAGNOSIS — M79671 Pain in right foot: Secondary | ICD-10-CM | POA: Diagnosis not present

## 2020-02-04 DIAGNOSIS — R269 Unspecified abnormalities of gait and mobility: Secondary | ICD-10-CM | POA: Diagnosis not present

## 2020-02-04 DIAGNOSIS — M2021 Hallux rigidus, right foot: Secondary | ICD-10-CM | POA: Diagnosis not present

## 2020-02-04 DIAGNOSIS — M79674 Pain in right toe(s): Secondary | ICD-10-CM | POA: Diagnosis not present

## 2020-02-15 DIAGNOSIS — F3112 Bipolar disorder, current episode manic without psychotic features, moderate: Secondary | ICD-10-CM | POA: Diagnosis not present

## 2020-02-29 DIAGNOSIS — F3112 Bipolar disorder, current episode manic without psychotic features, moderate: Secondary | ICD-10-CM | POA: Diagnosis not present

## 2020-03-13 DIAGNOSIS — F3189 Other bipolar disorder: Secondary | ICD-10-CM | POA: Diagnosis not present

## 2020-03-14 DIAGNOSIS — F3112 Bipolar disorder, current episode manic without psychotic features, moderate: Secondary | ICD-10-CM | POA: Diagnosis not present

## 2020-04-11 DIAGNOSIS — F3112 Bipolar disorder, current episode manic without psychotic features, moderate: Secondary | ICD-10-CM | POA: Diagnosis not present

## 2020-04-25 DIAGNOSIS — F3112 Bipolar disorder, current episode manic without psychotic features, moderate: Secondary | ICD-10-CM | POA: Diagnosis not present

## 2020-04-26 DIAGNOSIS — R059 Cough, unspecified: Secondary | ICD-10-CM | POA: Diagnosis not present

## 2020-05-09 DIAGNOSIS — F3112 Bipolar disorder, current episode manic without psychotic features, moderate: Secondary | ICD-10-CM | POA: Diagnosis not present

## 2020-05-23 DIAGNOSIS — F3112 Bipolar disorder, current episode manic without psychotic features, moderate: Secondary | ICD-10-CM | POA: Diagnosis not present

## 2020-06-01 DIAGNOSIS — F3162 Bipolar disorder, current episode mixed, moderate: Secondary | ICD-10-CM | POA: Diagnosis not present

## 2020-06-06 DIAGNOSIS — F3112 Bipolar disorder, current episode manic without psychotic features, moderate: Secondary | ICD-10-CM | POA: Diagnosis not present

## 2020-06-15 DIAGNOSIS — E538 Deficiency of other specified B group vitamins: Secondary | ICD-10-CM | POA: Diagnosis not present

## 2020-06-15 DIAGNOSIS — R739 Hyperglycemia, unspecified: Secondary | ICD-10-CM | POA: Diagnosis not present

## 2020-06-15 DIAGNOSIS — E611 Iron deficiency: Secondary | ICD-10-CM | POA: Diagnosis not present

## 2020-06-15 DIAGNOSIS — E785 Hyperlipidemia, unspecified: Secondary | ICD-10-CM | POA: Diagnosis not present

## 2020-06-15 DIAGNOSIS — R5382 Chronic fatigue, unspecified: Secondary | ICD-10-CM | POA: Diagnosis not present

## 2020-06-20 DIAGNOSIS — F3112 Bipolar disorder, current episode manic without psychotic features, moderate: Secondary | ICD-10-CM | POA: Diagnosis not present

## 2020-06-23 DIAGNOSIS — M2021 Hallux rigidus, right foot: Secondary | ICD-10-CM | POA: Diagnosis not present

## 2020-06-23 DIAGNOSIS — M79671 Pain in right foot: Secondary | ICD-10-CM | POA: Diagnosis not present

## 2020-06-23 DIAGNOSIS — Z1231 Encounter for screening mammogram for malignant neoplasm of breast: Secondary | ICD-10-CM | POA: Diagnosis not present

## 2020-06-23 DIAGNOSIS — R269 Unspecified abnormalities of gait and mobility: Secondary | ICD-10-CM | POA: Diagnosis not present

## 2020-06-23 DIAGNOSIS — M79674 Pain in right toe(s): Secondary | ICD-10-CM | POA: Diagnosis not present

## 2020-07-04 DIAGNOSIS — F3112 Bipolar disorder, current episode manic without psychotic features, moderate: Secondary | ICD-10-CM | POA: Diagnosis not present

## 2020-07-10 DIAGNOSIS — I10 Essential (primary) hypertension: Secondary | ICD-10-CM | POA: Diagnosis not present

## 2020-07-18 DIAGNOSIS — F3112 Bipolar disorder, current episode manic without psychotic features, moderate: Secondary | ICD-10-CM | POA: Diagnosis not present

## 2020-08-01 DIAGNOSIS — F3112 Bipolar disorder, current episode manic without psychotic features, moderate: Secondary | ICD-10-CM | POA: Diagnosis not present

## 2020-08-15 DIAGNOSIS — F3112 Bipolar disorder, current episode manic without psychotic features, moderate: Secondary | ICD-10-CM | POA: Diagnosis not present

## 2020-08-29 DIAGNOSIS — F3112 Bipolar disorder, current episode manic without psychotic features, moderate: Secondary | ICD-10-CM | POA: Diagnosis not present

## 2020-09-07 DIAGNOSIS — F3181 Bipolar II disorder: Secondary | ICD-10-CM | POA: Diagnosis not present

## 2020-09-19 DIAGNOSIS — F3112 Bipolar disorder, current episode manic without psychotic features, moderate: Secondary | ICD-10-CM | POA: Diagnosis not present

## 2020-09-21 DIAGNOSIS — J0110 Acute frontal sinusitis, unspecified: Secondary | ICD-10-CM | POA: Diagnosis not present

## 2020-10-17 DIAGNOSIS — F3112 Bipolar disorder, current episode manic without psychotic features, moderate: Secondary | ICD-10-CM | POA: Diagnosis not present

## 2020-10-31 DIAGNOSIS — F3112 Bipolar disorder, current episode manic without psychotic features, moderate: Secondary | ICD-10-CM | POA: Diagnosis not present

## 2020-11-07 DIAGNOSIS — F3112 Bipolar disorder, current episode manic without psychotic features, moderate: Secondary | ICD-10-CM | POA: Diagnosis not present

## 2020-11-21 DIAGNOSIS — F3112 Bipolar disorder, current episode manic without psychotic features, moderate: Secondary | ICD-10-CM | POA: Diagnosis not present

## 2020-11-24 DIAGNOSIS — M79674 Pain in right toe(s): Secondary | ICD-10-CM | POA: Diagnosis not present

## 2020-11-24 DIAGNOSIS — M2021 Hallux rigidus, right foot: Secondary | ICD-10-CM | POA: Diagnosis not present

## 2020-11-24 DIAGNOSIS — M79671 Pain in right foot: Secondary | ICD-10-CM | POA: Diagnosis not present

## 2020-11-24 DIAGNOSIS — R269 Unspecified abnormalities of gait and mobility: Secondary | ICD-10-CM | POA: Diagnosis not present

## 2020-12-05 DIAGNOSIS — F3112 Bipolar disorder, current episode manic without psychotic features, moderate: Secondary | ICD-10-CM | POA: Diagnosis not present

## 2020-12-20 DIAGNOSIS — F3112 Bipolar disorder, current episode manic without psychotic features, moderate: Secondary | ICD-10-CM | POA: Diagnosis not present

## 2021-01-03 DIAGNOSIS — F3112 Bipolar disorder, current episode manic without psychotic features, moderate: Secondary | ICD-10-CM | POA: Diagnosis not present

## 2021-01-10 DIAGNOSIS — N3001 Acute cystitis with hematuria: Secondary | ICD-10-CM | POA: Diagnosis not present

## 2021-01-10 DIAGNOSIS — I10 Essential (primary) hypertension: Secondary | ICD-10-CM | POA: Diagnosis not present

## 2021-01-17 DIAGNOSIS — F3112 Bipolar disorder, current episode manic without psychotic features, moderate: Secondary | ICD-10-CM | POA: Diagnosis not present

## 2021-01-31 DIAGNOSIS — F3112 Bipolar disorder, current episode manic without psychotic features, moderate: Secondary | ICD-10-CM | POA: Diagnosis not present

## 2021-02-14 DIAGNOSIS — F3112 Bipolar disorder, current episode manic without psychotic features, moderate: Secondary | ICD-10-CM | POA: Diagnosis not present

## 2021-03-05 DIAGNOSIS — F900 Attention-deficit hyperactivity disorder, predominantly inattentive type: Secondary | ICD-10-CM | POA: Diagnosis not present

## 2021-03-05 DIAGNOSIS — F3189 Other bipolar disorder: Secondary | ICD-10-CM | POA: Diagnosis not present

## 2021-03-14 DIAGNOSIS — F3112 Bipolar disorder, current episode manic without psychotic features, moderate: Secondary | ICD-10-CM | POA: Diagnosis not present

## 2021-04-11 DIAGNOSIS — F3112 Bipolar disorder, current episode manic without psychotic features, moderate: Secondary | ICD-10-CM | POA: Diagnosis not present

## 2021-04-16 DIAGNOSIS — M79674 Pain in right toe(s): Secondary | ICD-10-CM | POA: Diagnosis not present

## 2021-04-16 DIAGNOSIS — M25561 Pain in right knee: Secondary | ICD-10-CM | POA: Diagnosis not present

## 2021-04-25 DIAGNOSIS — F3112 Bipolar disorder, current episode manic without psychotic features, moderate: Secondary | ICD-10-CM | POA: Diagnosis not present

## 2021-06-13 DIAGNOSIS — M797 Fibromyalgia: Secondary | ICD-10-CM | POA: Diagnosis not present

## 2021-06-13 DIAGNOSIS — F319 Bipolar disorder, unspecified: Secondary | ICD-10-CM | POA: Diagnosis not present

## 2021-06-13 DIAGNOSIS — E785 Hyperlipidemia, unspecified: Secondary | ICD-10-CM | POA: Diagnosis not present

## 2021-06-13 DIAGNOSIS — I10 Essential (primary) hypertension: Secondary | ICD-10-CM | POA: Diagnosis not present

## 2021-11-15 DIAGNOSIS — E785 Hyperlipidemia, unspecified: Secondary | ICD-10-CM | POA: Diagnosis not present

## 2021-11-15 DIAGNOSIS — E538 Deficiency of other specified B group vitamins: Secondary | ICD-10-CM | POA: Diagnosis not present

## 2021-11-15 DIAGNOSIS — I10 Essential (primary) hypertension: Secondary | ICD-10-CM | POA: Diagnosis not present

## 2021-11-15 DIAGNOSIS — R739 Hyperglycemia, unspecified: Secondary | ICD-10-CM | POA: Diagnosis not present

## 2021-11-16 DIAGNOSIS — D518 Other vitamin B12 deficiency anemias: Secondary | ICD-10-CM | POA: Diagnosis not present

## 2022-05-15 DIAGNOSIS — F3112 Bipolar disorder, current episode manic without psychotic features, moderate: Secondary | ICD-10-CM | POA: Diagnosis not present

## 2022-05-27 DIAGNOSIS — Z1231 Encounter for screening mammogram for malignant neoplasm of breast: Secondary | ICD-10-CM | POA: Diagnosis not present

## 2022-05-29 DIAGNOSIS — E559 Vitamin D deficiency, unspecified: Secondary | ICD-10-CM | POA: Diagnosis not present

## 2022-05-29 DIAGNOSIS — R739 Hyperglycemia, unspecified: Secondary | ICD-10-CM | POA: Diagnosis not present

## 2022-05-29 DIAGNOSIS — M797 Fibromyalgia: Secondary | ICD-10-CM | POA: Diagnosis not present

## 2022-05-29 DIAGNOSIS — I10 Essential (primary) hypertension: Secondary | ICD-10-CM | POA: Diagnosis not present

## 2022-05-29 DIAGNOSIS — E785 Hyperlipidemia, unspecified: Secondary | ICD-10-CM | POA: Diagnosis not present

## 2022-06-05 DIAGNOSIS — F3112 Bipolar disorder, current episode manic without psychotic features, moderate: Secondary | ICD-10-CM | POA: Diagnosis not present

## 2022-06-05 DIAGNOSIS — R921 Mammographic calcification found on diagnostic imaging of breast: Secondary | ICD-10-CM | POA: Diagnosis not present

## 2022-06-17 DIAGNOSIS — D241 Benign neoplasm of right breast: Secondary | ICD-10-CM | POA: Diagnosis not present

## 2022-06-17 DIAGNOSIS — R921 Mammographic calcification found on diagnostic imaging of breast: Secondary | ICD-10-CM | POA: Diagnosis not present

## 2022-06-17 DIAGNOSIS — N6011 Diffuse cystic mastopathy of right breast: Secondary | ICD-10-CM | POA: Diagnosis not present

## 2022-06-26 DIAGNOSIS — F3112 Bipolar disorder, current episode manic without psychotic features, moderate: Secondary | ICD-10-CM | POA: Diagnosis not present

## 2022-07-17 DIAGNOSIS — F3112 Bipolar disorder, current episode manic without psychotic features, moderate: Secondary | ICD-10-CM | POA: Diagnosis not present

## 2022-07-31 DIAGNOSIS — M205X2 Other deformities of toe(s) (acquired), left foot: Secondary | ICD-10-CM | POA: Diagnosis not present

## 2022-08-06 DIAGNOSIS — F3112 Bipolar disorder, current episode manic without psychotic features, moderate: Secondary | ICD-10-CM | POA: Diagnosis not present

## 2022-08-13 DIAGNOSIS — F5101 Primary insomnia: Secondary | ICD-10-CM | POA: Diagnosis not present

## 2022-08-13 DIAGNOSIS — F3181 Bipolar II disorder: Secondary | ICD-10-CM | POA: Diagnosis not present

## 2022-08-13 DIAGNOSIS — F411 Generalized anxiety disorder: Secondary | ICD-10-CM | POA: Diagnosis not present

## 2022-08-19 DIAGNOSIS — G8929 Other chronic pain: Secondary | ICD-10-CM | POA: Diagnosis not present

## 2022-08-19 DIAGNOSIS — M205X1 Other deformities of toe(s) (acquired), right foot: Secondary | ICD-10-CM | POA: Diagnosis not present

## 2022-08-19 DIAGNOSIS — R262 Difficulty in walking, not elsewhere classified: Secondary | ICD-10-CM | POA: Diagnosis not present

## 2022-08-19 DIAGNOSIS — Z01818 Encounter for other preprocedural examination: Secondary | ICD-10-CM | POA: Diagnosis not present

## 2022-08-23 DIAGNOSIS — G8918 Other acute postprocedural pain: Secondary | ICD-10-CM | POA: Diagnosis not present

## 2022-08-23 DIAGNOSIS — M2011 Hallux valgus (acquired), right foot: Secondary | ICD-10-CM | POA: Diagnosis not present

## 2022-08-23 DIAGNOSIS — M21611 Bunion of right foot: Secondary | ICD-10-CM | POA: Diagnosis not present

## 2022-08-23 DIAGNOSIS — M205X1 Other deformities of toe(s) (acquired), right foot: Secondary | ICD-10-CM | POA: Diagnosis not present

## 2022-08-28 DIAGNOSIS — M2011 Hallux valgus (acquired), right foot: Secondary | ICD-10-CM | POA: Diagnosis not present

## 2022-09-11 DIAGNOSIS — M2011 Hallux valgus (acquired), right foot: Secondary | ICD-10-CM | POA: Diagnosis not present

## 2022-09-18 DIAGNOSIS — F3112 Bipolar disorder, current episode manic without psychotic features, moderate: Secondary | ICD-10-CM | POA: Diagnosis not present

## 2022-09-25 DIAGNOSIS — M2011 Hallux valgus (acquired), right foot: Secondary | ICD-10-CM | POA: Diagnosis not present

## 2022-10-09 DIAGNOSIS — F3112 Bipolar disorder, current episode manic without psychotic features, moderate: Secondary | ICD-10-CM | POA: Diagnosis not present

## 2022-10-09 DIAGNOSIS — M2011 Hallux valgus (acquired), right foot: Secondary | ICD-10-CM | POA: Diagnosis not present

## 2022-10-14 DIAGNOSIS — M810 Age-related osteoporosis without current pathological fracture: Secondary | ICD-10-CM | POA: Diagnosis not present

## 2022-10-14 DIAGNOSIS — R739 Hyperglycemia, unspecified: Secondary | ICD-10-CM | POA: Diagnosis not present

## 2022-10-14 DIAGNOSIS — E7211 Homocystinuria: Secondary | ICD-10-CM | POA: Diagnosis not present

## 2022-10-14 DIAGNOSIS — Z13220 Encounter for screening for lipoid disorders: Secondary | ICD-10-CM | POA: Diagnosis not present

## 2022-10-14 DIAGNOSIS — E559 Vitamin D deficiency, unspecified: Secondary | ICD-10-CM | POA: Diagnosis not present

## 2022-10-14 DIAGNOSIS — E039 Hypothyroidism, unspecified: Secondary | ICD-10-CM | POA: Diagnosis not present

## 2022-10-30 DIAGNOSIS — F3112 Bipolar disorder, current episode manic without psychotic features, moderate: Secondary | ICD-10-CM | POA: Diagnosis not present

## 2022-10-30 DIAGNOSIS — M2011 Hallux valgus (acquired), right foot: Secondary | ICD-10-CM | POA: Diagnosis not present

## 2022-11-13 DIAGNOSIS — M2011 Hallux valgus (acquired), right foot: Secondary | ICD-10-CM | POA: Diagnosis not present

## 2022-11-20 DIAGNOSIS — F3112 Bipolar disorder, current episode manic without psychotic features, moderate: Secondary | ICD-10-CM | POA: Diagnosis not present

## 2022-12-04 DIAGNOSIS — M2011 Hallux valgus (acquired), right foot: Secondary | ICD-10-CM | POA: Diagnosis not present

## 2022-12-04 DIAGNOSIS — S90851A Superficial foreign body, right foot, initial encounter: Secondary | ICD-10-CM | POA: Diagnosis not present

## 2022-12-09 DIAGNOSIS — N951 Menopausal and female climacteric states: Secondary | ICD-10-CM | POA: Diagnosis not present

## 2022-12-09 DIAGNOSIS — Z79890 Hormone replacement therapy (postmenopausal): Secondary | ICD-10-CM | POA: Diagnosis not present

## 2022-12-11 DIAGNOSIS — F3112 Bipolar disorder, current episode manic without psychotic features, moderate: Secondary | ICD-10-CM | POA: Diagnosis not present

## 2022-12-31 DIAGNOSIS — F3181 Bipolar II disorder: Secondary | ICD-10-CM | POA: Diagnosis not present

## 2022-12-31 DIAGNOSIS — F5101 Primary insomnia: Secondary | ICD-10-CM | POA: Diagnosis not present

## 2022-12-31 DIAGNOSIS — F411 Generalized anxiety disorder: Secondary | ICD-10-CM | POA: Diagnosis not present

## 2023-01-01 DIAGNOSIS — F3112 Bipolar disorder, current episode manic without psychotic features, moderate: Secondary | ICD-10-CM | POA: Diagnosis not present

## 2023-01-16 DIAGNOSIS — M2011 Hallux valgus (acquired), right foot: Secondary | ICD-10-CM | POA: Diagnosis not present

## 2023-01-16 DIAGNOSIS — M205X1 Other deformities of toe(s) (acquired), right foot: Secondary | ICD-10-CM | POA: Diagnosis not present

## 2023-01-22 DIAGNOSIS — F3112 Bipolar disorder, current episode manic without psychotic features, moderate: Secondary | ICD-10-CM | POA: Diagnosis not present

## 2023-02-12 DIAGNOSIS — F3112 Bipolar disorder, current episode manic without psychotic features, moderate: Secondary | ICD-10-CM | POA: Diagnosis not present

## 2023-02-18 DIAGNOSIS — M205X2 Other deformities of toe(s) (acquired), left foot: Secondary | ICD-10-CM | POA: Diagnosis not present

## 2023-02-18 DIAGNOSIS — M2011 Hallux valgus (acquired), right foot: Secondary | ICD-10-CM | POA: Diagnosis not present

## 2023-03-12 DIAGNOSIS — F3112 Bipolar disorder, current episode manic without psychotic features, moderate: Secondary | ICD-10-CM | POA: Diagnosis not present

## 2023-03-25 DIAGNOSIS — M2011 Hallux valgus (acquired), right foot: Secondary | ICD-10-CM | POA: Diagnosis not present

## 2023-04-02 DIAGNOSIS — F3112 Bipolar disorder, current episode manic without psychotic features, moderate: Secondary | ICD-10-CM | POA: Diagnosis not present

## 2023-06-16 ENCOUNTER — Ambulatory Visit: Payer: BLUE CROSS/BLUE SHIELD | Admitting: Family Medicine
